# Patient Record
Sex: Female | Born: 1985 | Hispanic: No | Marital: Single | Smoking: Former smoker
Health system: Southern US, Community
[De-identification: ages and names within clinical notes are randomized; demographics above are authoritative.]

## PROBLEM LIST (undated history)

## (undated) ENCOUNTER — Inpatient Hospital Stay (HOSPITAL_COMMUNITY): Payer: Self-pay

## (undated) DIAGNOSIS — M722 Plantar fascial fibromatosis: Secondary | ICD-10-CM

## (undated) DIAGNOSIS — R87629 Unspecified abnormal cytological findings in specimens from vagina: Secondary | ICD-10-CM

## (undated) DIAGNOSIS — N879 Dysplasia of cervix uteri, unspecified: Secondary | ICD-10-CM

## (undated) HISTORY — DX: Unspecified abnormal cytological findings in specimens from vagina: R87.629

---

## 2009-05-14 HISTORY — PX: WISDOM TOOTH EXTRACTION: SHX21

## 2014-01-08 ENCOUNTER — Encounter (HOSPITAL_COMMUNITY): Payer: Self-pay | Admitting: Emergency Medicine

## 2014-01-08 ENCOUNTER — Emergency Department (HOSPITAL_COMMUNITY)
Admission: EM | Admit: 2014-01-08 | Discharge: 2014-01-08 | Discharge status: Against Medical Advice | Payer: Medicaid Other | Attending: Emergency Medicine | Admitting: Emergency Medicine

## 2014-01-08 DIAGNOSIS — R110 Nausea: Secondary | ICD-10-CM | POA: Insufficient documentation

## 2014-01-08 DIAGNOSIS — O9989 Other specified diseases and conditions complicating pregnancy, childbirth and the puerperium: Secondary | ICD-10-CM | POA: Insufficient documentation

## 2014-01-08 HISTORY — DX: Dysplasia of cervix uteri, unspecified: N87.9

## 2014-01-08 LAB — URINALYSIS, ROUTINE W REFLEX MICROSCOPIC
Bilirubin Urine: NEGATIVE
Glucose, UA: NEGATIVE mg/dL
Hgb urine dipstick: NEGATIVE
KETONES UR: NEGATIVE mg/dL
LEUKOCYTES UA: NEGATIVE
NITRITE: NEGATIVE
PROTEIN: NEGATIVE mg/dL
Specific Gravity, Urine: 1.019 (ref 1.005–1.030)
Urobilinogen, UA: 0.2 mg/dL (ref 0.0–1.0)
pH: 5 (ref 5.0–8.0)

## 2014-01-08 LAB — POC URINE PREG, ED: Preg Test, Ur: POSITIVE — AB

## 2014-01-08 NOTE — ED Notes (Signed)
Presents [redacted] weeks pregnant with constant nausea all day, given RX at [facility] for phenergan-will not take it because she read it causes birth defects. denise vaginal pain or bleeding-reports mild lower abdominal cramping. Reports white milky vaginal discharge.

## 2014-01-08 NOTE — ED Notes (Signed)
Pt does not wish to stay any longer

## 2014-03-04 ENCOUNTER — Inpatient Hospital Stay (HOSPITAL_COMMUNITY)
Admission: AD | Admit: 2014-03-04 | Discharge: 2014-03-04 | Disposition: A | Discharge status: Home / Self Care | Payer: Medicaid Other | Source: Ambulatory Visit | Attending: Obstetrics and Gynecology | Admitting: Obstetrics and Gynecology

## 2014-03-04 ENCOUNTER — Encounter (HOSPITAL_COMMUNITY): Payer: Self-pay

## 2014-03-04 DIAGNOSIS — W19XXXA Unspecified fall, initial encounter: Secondary | ICD-10-CM

## 2014-03-04 DIAGNOSIS — A5901 Trichomonal vulvovaginitis: Secondary | ICD-10-CM | POA: Diagnosis not present

## 2014-03-04 DIAGNOSIS — Y92009 Unspecified place in unspecified non-institutional (private) residence as the place of occurrence of the external cause: Secondary | ICD-10-CM

## 2014-03-04 DIAGNOSIS — Y9389 Activity, other specified: Secondary | ICD-10-CM

## 2014-03-04 DIAGNOSIS — Z3A14 14 weeks gestation of pregnancy: Secondary | ICD-10-CM | POA: Insufficient documentation

## 2014-03-04 DIAGNOSIS — O98312 Other infections with a predominantly sexual mode of transmission complicating pregnancy, second trimester: Secondary | ICD-10-CM | POA: Diagnosis not present

## 2014-03-04 DIAGNOSIS — R109 Unspecified abdominal pain: Secondary | ICD-10-CM | POA: Diagnosis not present

## 2014-03-04 DIAGNOSIS — O26892 Other specified pregnancy related conditions, second trimester: Secondary | ICD-10-CM | POA: Insufficient documentation

## 2014-03-04 DIAGNOSIS — W010XXA Fall on same level from slipping, tripping and stumbling without subsequent striking against object, initial encounter: Secondary | ICD-10-CM | POA: Insufficient documentation

## 2014-03-04 DIAGNOSIS — O99212 Obesity complicating pregnancy, second trimester: Secondary | ICD-10-CM | POA: Diagnosis not present

## 2014-03-04 DIAGNOSIS — O23592 Infection of other part of genital tract in pregnancy, second trimester: Secondary | ICD-10-CM

## 2014-03-04 DIAGNOSIS — S3981XA Other specified injuries of abdomen, initial encounter: Secondary | ICD-10-CM

## 2014-03-04 LAB — URINE MICROSCOPIC-ADD ON

## 2014-03-04 LAB — URINALYSIS, ROUTINE W REFLEX MICROSCOPIC
BILIRUBIN URINE: NEGATIVE
Glucose, UA: NEGATIVE mg/dL
HGB URINE DIPSTICK: NEGATIVE
KETONES UR: NEGATIVE mg/dL
NITRITE: NEGATIVE
PROTEIN: NEGATIVE mg/dL
Specific Gravity, Urine: 1.025 (ref 1.005–1.030)
Urobilinogen, UA: 0.2 mg/dL (ref 0.0–1.0)
pH: 5.5 (ref 5.0–8.0)

## 2014-03-04 MED ORDER — PRENATAL PLUS 27-1 MG PO TABS: 1.0000 | ORAL_TABLET | Freq: Every day | ORAL | Status: DC

## 2014-03-04 MED ORDER — METRONIDAZOLE 500 MG PO TABS: 500.0000 mg | ORAL_TABLET | Freq: Three times a day (TID) | ORAL | Status: DC

## 2014-03-04 MED ORDER — ONDANSETRON 8 MG PO TBDP: 8.0000 mg | ORAL_TABLET | Freq: Once | ORAL | Status: DC

## 2014-03-04 NOTE — MAU Provider Note (Signed)

## 2014-03-04 NOTE — MAU Provider Note (Signed)
Chief Complaint: Fall   First Provider Initiated Contact with Patient 03/04/14 1808     SUBJECTIVE HPI: Jenna Tran is a 28 y.o. G4P2012 at 5151w5d by LMP who presents with mild abdominal cramping after tripping over her puppy today and landing on belly. Denies injury but concerned for fetal well-being. Denies abnormal vaginal discharge.   Pregnancy course: No other problems but is in process of changing providers (to [facility] from Dr. Shawnie Ponsorn who did PN labs and US, no exam)   Past Medical History  Diagnosis Date  . Dysplasia, cervix uteri    OB History  Gravida Para Term Preterm AB SAB TAB Ectopic Multiple Living  4 2 2  1 1    2     # Outcome Date GA Lbr Len/2nd Weight Sex Delivery Anes PTL Lv  4 CUR           3 SAB           2 TRM      SVD   Y  1 TRM      SVD   Y     History reviewed. No pertinent past surgical history. History   Social History  . Marital Status: Single    Spouse Name: N/A    Number of Children: N/A  . Years of Education: N/A   Occupational History  . Not on file.   Social History Main Topics  . Smoking status: Never Smoker   . Smokeless tobacco: Not on file  . Alcohol Use: No  . Drug Use: No  . Sexual Activity: Yes    Birth Control/ Protection: None   Other Topics Concern  . Not on file   Social History Narrative  . No narrative on file   No current facility-administered medications on file prior to encounter.   No current outpatient prescriptions on file prior to encounter.   No Known Allergies  ROS: Pertinent items in HPI  OBJECTIVE Blood pressure 119/75, pulse 93, temperature 98.4 F (36.9 C), temperature source Oral, resp. rate 16, height 5\' 2"  (1.575 m), weight 88.179 kg (194 lb 6.4 oz), last menstrual period 11/21/2013, SpO2 98.00%. GENERAL: Well-developed, well-nourished female in no acute distress.  HEENT: Normocephalic HEART: normal rate RESP: normal effort ABDOMEN: Soft, non-tender, skin intact, 14-16 wk size;  DT140 EXTREMITIES: Nontender, no edema NEURO: Alert and oriented  LAB RESULTS Results for orders placed during the hospital encounter of 03/04/14 (from the past 24 hour(s))  URINALYSIS, ROUTINE W REFLEX MICROSCOPIC     Status: Abnormal   Collection Time    03/04/14  4:07 PM      Result Value Ref Range   Color, Urine YELLOW  YELLOW   APPearance CLEAR  CLEAR   Specific Gravity, Urine 1.025  1.005 - 1.030   pH 5.5  5.0 - 8.0   Glucose, UA NEGATIVE  NEGATIVE mg/dL   Hgb urine dipstick NEGATIVE  NEGATIVE   Bilirubin Urine NEGATIVE  NEGATIVE   Ketones, ur NEGATIVE  NEGATIVE mg/dL   Protein, ur NEGATIVE  NEGATIVE mg/dL   Urobilinogen, UA 0.2  0.0 - 1.0 mg/dL   Nitrite NEGATIVE  NEGATIVE   Leukocytes, UA MODERATE (*) NEGATIVE  URINE MICROSCOPIC-ADD ON     Status: Abnormal   Collection Time    03/04/14  4:07 PM      Result Value Ref Range   Squamous Epithelial / LPF FEW (*) RARE   WBC, UA 11-20  <3 WBC/hpf   RBC / HPF 7-10  <  3 RBC/hpf   Bacteria, UA FEW (*) RARE   Urine-Other TRICHOMONAS PRESENT      IMAGING No results found.  MAU COURSE  ASSESSMENT 1. Fall at home, initial encounter   2. Trichomonal vaginitis during pregnancy, second trimester   G1 at 2855w5d viable pregnancy Obesity  PLAN Discharge home with reassurance Advised partner tx for trich also and to get TOC at [facility]   Medication List         metroNIDAZOLE 500 MG tablet  Commonly known as:  FLAGYL  Take 1 tablet (500 mg total) by mouth 3 (three) times daily.     prenatal vitamin w/FE, FA 27-1 MG Tabs tablet  Take 1 tablet by mouth daily.      Tylenol for pain  Follow-up Information   [facility] (Keep yur scheduled appointment)    Contact information:   [address] [place] [id number] [phone]      Danae OrleansDeirdre C Teal Raben, CNM 03/04/2014  6:12 PM

## 2014-03-04 NOTE — MAU Note (Signed)
Patient state she tripped and fell over your puppy and hit her abdomen at 1200. States she has light cramping, no bleeding or discharge.

## 2014-03-11 ENCOUNTER — Inpatient Hospital Stay (HOSPITAL_COMMUNITY)
Admission: AD | Admit: 2014-03-11 | Discharge: 2014-03-11 | Disposition: A | Discharge status: Home / Self Care | Payer: Medicaid Other | Source: Ambulatory Visit | Attending: Obstetrics & Gynecology | Admitting: Obstetrics & Gynecology

## 2014-03-11 DIAGNOSIS — Z3482 Encounter for supervision of other normal pregnancy, second trimester: Secondary | ICD-10-CM | POA: Diagnosis present

## 2014-03-11 DIAGNOSIS — Z3A15 15 weeks gestation of pregnancy: Secondary | ICD-10-CM | POA: Diagnosis not present

## 2014-03-11 DIAGNOSIS — O0932 Supervision of pregnancy with insufficient antenatal care, second trimester: Secondary | ICD-10-CM

## 2014-03-11 NOTE — Discharge Instructions (Signed)
Vaginal Bleeding During Pregnancy, Second Trimester ° A small amount of bleeding (spotting) from the vagina is common in pregnancy. Sometimes the bleeding is normal and is not a problem, and sometimes it is a sign of something serious. Be sure to tell your doctor about any bleeding from your vagina right away. °HOME CARE °· Watch your condition for any changes. °· Follow your doctor's instructions about how active you can be. °· If you are on bed rest: °¨ You may need to stay in bed and only get up to use the bathroom. °¨ You may be allowed to do some activities. °¨ If you need help, make plans for someone to help you. °· Write down: °¨ The number of pads you use each day. °¨ How often you change pads. °¨ How soaked (saturated) your pads are. °· Do not use tampons. °· Do not douche. °· Do not have sex or orgasms until your doctor says it is okay. °· If you pass any tissue from your vagina, save the tissue so you can show it to your doctor. °· Only take medicines as told by your doctor. °· Do not take aspirin because it can make you bleed. °· Do not exercise, lift heavy weights, or do any activities that take a lot of energy and effort unless your doctor says it is okay. °· Keep all follow-up visits as told by your doctor. °GET HELP IF:  °· You bleed from your vagina. °· You have cramps. °· You have labor pains. °· You have a fever that does not go away after you take medicine. °GET HELP RIGHT AWAY IF: °· You have very bad cramps in your back or belly (abdomen). °· You have contractions. °· You have chills. °· You pass large clots or tissue from your vagina. °· You bleed more. °· You feel light-headed or weak. °· You pass out (faint). °· You are leaking fluid or have a gush of fluid from your vagina. °MAKE SURE YOU: °· Understand these instructions. °· Will watch your condition. °· Will get help right away if you are not doing well or get worse. °Document Released: 09/14/2013 Document Reviewed: 01/05/2013 °ExitCare®  Patient Information ©2015 [employer]. This information is not intended to replace advice given to you by your health care provider. Make sure you discuss any questions you have with your health care provider. ° °

## 2014-03-11 NOTE — MAU Provider Note (Signed)
Ms. Jenna Tran is a 28 y.o. [phone] at 493w5d who presents to MAU today asking for help to establish prenatal care. Patient denies any complications with this or previous pregnancy. She also denies complaints today. She would like to have a CNM.   BP 126/67  Pulse 97  Temp(Src) 98.3 F (36.8 C) (Oral)  Resp 18  Ht 5\' 1"  (1.549 m)  Wt 194 lb 6.4 oz (88.179 kg)  BMI 36.75 kg/m2  LMP 11/21/2013 GENERAL: Well-developed, well-nourished female in no acute distress.  HEENT: Normocephalic, atraumatic.   LUNGS: Effort normal HEART: Regular rate  SKIN: Warm, dry and without erythema PSYCH: Normal mood and affect  MDM FHR - 135 bpm with doppler  A: SIUP at 533w5d  P: Discharge home [facility]. They will call her with an appointment Patient may return to MAU as needed or if her condition were to change or worsen  Marny LowensteinJulie N Winson Eichorn, PA-C 03/11/2014 4:51 PM

## 2014-03-11 NOTE — MAU Provider Note (Signed)
Attestation of Attending Supervision of Advanced Practitioner (CNM/NP): Evaluation and management procedures were performed by the Advanced Practitioner under my supervision and collaboration.  I have reviewed the Advanced Practitioner's note and chart, and I agree with the management and plan.  HARRAWAY-SMITH, Takeira Yanes 7:38 PM     

## 2014-03-11 NOTE — MAU Note (Signed)
Pt discussed her concerns and questions with J.Wenzel,PA. List given for midwives that are in the area.

## 2014-03-11 NOTE — MAU Note (Signed)
Pt has been trying to establish prenatal care. Having difficulty with that process. Nopt having any promplems today. Has some questions for midwife.

## 2014-03-16 ENCOUNTER — Encounter (HOSPITAL_COMMUNITY): Payer: Self-pay

## 2014-03-24 ENCOUNTER — Encounter: Payer: Medicaid Other | Admitting: Advanced Practice Midwife

## 2014-03-29 ENCOUNTER — Ambulatory Visit (INDEPENDENT_AMBULATORY_CARE_PROVIDER_SITE_OTHER): Payer: Medicaid Other | Admitting: Family Medicine

## 2014-03-29 ENCOUNTER — Other Ambulatory Visit: Payer: Self-pay | Admitting: Obstetrics and Gynecology

## 2014-03-29 ENCOUNTER — Encounter: Payer: Self-pay | Admitting: Obstetrics and Gynecology

## 2014-03-29 ENCOUNTER — Other Ambulatory Visit (HOSPITAL_COMMUNITY)
Admission: RE | Admit: 2014-03-29 | Discharge: 2014-03-29 | Disposition: A | Discharge status: Home / Self Care | Payer: Medicaid Other | Source: Ambulatory Visit | Attending: Obstetrics and Gynecology | Admitting: Obstetrics and Gynecology

## 2014-03-29 VITALS — BP 112/68 | HR 83 | Wt 196.1 lb

## 2014-03-29 DIAGNOSIS — Z3492 Encounter for supervision of normal pregnancy, unspecified, second trimester: Secondary | ICD-10-CM

## 2014-03-29 DIAGNOSIS — Z3490 Encounter for supervision of normal pregnancy, unspecified, unspecified trimester: Secondary | ICD-10-CM | POA: Insufficient documentation

## 2014-03-29 DIAGNOSIS — O0932 Supervision of pregnancy with insufficient antenatal care, second trimester: Secondary | ICD-10-CM

## 2014-03-29 DIAGNOSIS — T7422XS Child sexual abuse, confirmed, sequela: Secondary | ICD-10-CM

## 2014-03-29 DIAGNOSIS — Z1151 Encounter for screening for human papillomavirus (HPV): Secondary | ICD-10-CM | POA: Insufficient documentation

## 2014-03-29 DIAGNOSIS — R87810 Cervical high risk human papillomavirus (HPV) DNA test positive: Secondary | ICD-10-CM | POA: Diagnosis present

## 2014-03-29 DIAGNOSIS — Z8619 Personal history of other infectious and parasitic diseases: Secondary | ICD-10-CM

## 2014-03-29 DIAGNOSIS — Z87440 Personal history of urinary (tract) infections: Secondary | ICD-10-CM

## 2014-03-29 DIAGNOSIS — Z01419 Encounter for gynecological examination (general) (routine) without abnormal findings: Secondary | ICD-10-CM | POA: Insufficient documentation

## 2014-03-29 DIAGNOSIS — T7422XA Child sexual abuse, confirmed, initial encounter: Secondary | ICD-10-CM | POA: Insufficient documentation

## 2014-03-29 LAB — POCT URINALYSIS DIP (DEVICE)
Bilirubin Urine: NEGATIVE
Glucose, UA: NEGATIVE mg/dL
Hgb urine dipstick: NEGATIVE
Ketones, ur: NEGATIVE mg/dL
NITRITE: NEGATIVE
Protein, ur: NEGATIVE mg/dL
SPECIFIC GRAVITY, URINE: 1.02 (ref 1.005–1.030)
UROBILINOGEN UA: 0.2 mg/dL (ref 0.0–1.0)
pH: 5 (ref 5.0–8.0)

## 2014-03-29 LAB — WET PREP, GENITAL
Trich, Wet Prep: NONE SEEN
YEAST WET PREP: NONE SEEN

## 2014-03-29 LAB — OB RESULTS CONSOLE GC/CHLAMYDIA
Chlamydia: NEGATIVE
Gonorrhea: NEGATIVE

## 2014-03-29 NOTE — Progress Notes (Signed)
Patient never took treatment for trich stated she wants a second opinion.

## 2014-03-29 NOTE — Progress Notes (Signed)
Anatomy U/S with Radiology 04/05/14 @ 1030a.

## 2014-03-29 NOTE — Addendum Note (Signed)
Addended by: Sherre LainASH, Abad Manard A on: 03/29/2014 01:17 PM   Modules accepted: Orders
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
## 2014-03-30 LAB — OBSTETRIC PANEL
Antibody Screen: NEGATIVE
Basophils Absolute: 0 10*3/uL (ref 0.0–0.1)
Basophils Relative: 0 % (ref 0–1)
Eosinophils Absolute: 0.1 10*3/uL (ref 0.0–0.7)
Eosinophils Relative: 1 % (ref 0–5)
HEMATOCRIT: 37.2 % (ref 36.0–46.0)
Hemoglobin: 12.4 g/dL (ref 12.0–15.0)
Hepatitis B Surface Ag: NEGATIVE
Lymphocytes Relative: 18 % (ref 12–46)
Lymphs Abs: 2.3 10*3/uL (ref 0.7–4.0)
MCH: 25.8 pg — ABNORMAL LOW (ref 26.0–34.0)
MCHC: 33.3 g/dL (ref 30.0–36.0)
MCV: 77.3 fL — AB (ref 78.0–100.0)
MONO ABS: 0.8 10*3/uL (ref 0.1–1.0)
MONOS PCT: 6 % (ref 3–12)
MPV: 10 fL (ref 9.4–12.4)
NEUTROS ABS: 9.6 10*3/uL — AB (ref 1.7–7.7)
Neutrophils Relative %: 75 % (ref 43–77)
Platelets: 349 10*3/uL (ref 150–400)
RBC: 4.81 MIL/uL (ref 3.87–5.11)
RDW: 15.9 % — ABNORMAL HIGH (ref 11.5–15.5)
RH TYPE: POSITIVE
Rubella: 2.03 Index — ABNORMAL HIGH (ref <0.90)
WBC: 12.8 10*3/uL — AB (ref 4.0–10.5)

## 2014-03-30 LAB — GC/CHLAMYDIA PROBE AMP
CT Probe RNA: NEGATIVE
GC Probe RNA: NEGATIVE

## 2014-03-30 LAB — HIV ANTIBODY (ROUTINE TESTING W REFLEX): HIV 1&2 Ab, 4th Generation: NONREACTIVE

## 2014-03-30 MED ORDER — METRONIDAZOLE 500 MG PO TABS: 500.0000 mg | ORAL_TABLET | Freq: Two times a day (BID) | ORAL | Status: DC

## 2014-03-30 NOTE — Addendum Note (Signed)
Addended by: Elita BooneOBERTS, CAROLINE C on: 03/30/2014 12:32 AM   Modules accepted: Orders

## 2014-03-31 LAB — PRESCRIPTION MONITORING PROFILE (19 PANEL)
Amphetamine/Meth: NEGATIVE ng/mL
BENZODIAZEPINE SCREEN, URINE: NEGATIVE ng/mL
Barbiturate Screen, Urine: NEGATIVE ng/mL
Buprenorphine, Urine: NEGATIVE ng/mL
CARISOPRODOL, URINE: NEGATIVE ng/mL
COCAINE METABOLITES: NEGATIVE ng/mL
CREATININE, URINE: 115.53 mg/dL (ref >20.0)
Cannabinoid Scrn, Ur: NEGATIVE ng/mL
ECSTASY: NEGATIVE ng/mL
FENTANYL URINE: NEGATIVE ng/mL
MEPERIDINE UR: NEGATIVE ng/mL
METHADONE SCREEN, URINE: NEGATIVE ng/mL
Methaqualone: NEGATIVE ng/mL
NITRITES URINE, INITIAL: NEGATIVE ug/mL
OXYCODONE SCRN UR: NEGATIVE ng/mL
Opiate Screen, Urine: NEGATIVE ng/mL
Phencyclidine, Ur: NEGATIVE ng/mL
Propoxyphene: NEGATIVE ng/mL
TRAMADOL UR: NEGATIVE ng/mL
Tapentadol, urine: NEGATIVE ng/mL
ZOLPIDEM, URINE: NEGATIVE ng/mL
pH, Initial: 5.3 pH (ref 4.5–8.9)

## 2014-03-31 LAB — CULTURE, OB URINE
COLONY COUNT: NO GROWTH
ORGANISM ID, BACTERIA: NO GROWTH

## 2014-03-31 LAB — CYTOLOGY - PAP

## 2014-04-05 ENCOUNTER — Ambulatory Visit (HOSPITAL_COMMUNITY)
Admission: RE | Admit: 2014-04-05 | Discharge: 2014-04-05 | Disposition: A | Discharge status: Home / Self Care | Payer: Medicaid Other | Source: Ambulatory Visit | Attending: Obstetrics and Gynecology | Admitting: Obstetrics and Gynecology

## 2014-04-05 ENCOUNTER — Other Ambulatory Visit: Payer: Self-pay | Admitting: Obstetrics and Gynecology

## 2014-04-05 DIAGNOSIS — Z3A19 19 weeks gestation of pregnancy: Secondary | ICD-10-CM | POA: Diagnosis not present

## 2014-04-05 DIAGNOSIS — Z36 Encounter for antenatal screening of mother: Secondary | ICD-10-CM | POA: Diagnosis not present

## 2014-04-05 DIAGNOSIS — Z3492 Encounter for supervision of normal pregnancy, unspecified, second trimester: Secondary | ICD-10-CM

## 2014-04-05 DIAGNOSIS — O99212 Obesity complicating pregnancy, second trimester: Secondary | ICD-10-CM | POA: Insufficient documentation

## 2014-04-05 DIAGNOSIS — Z3689 Encounter for other specified antenatal screening: Secondary | ICD-10-CM | POA: Insufficient documentation

## 2014-04-06 ENCOUNTER — Encounter: Payer: Self-pay | Admitting: Obstetrics and Gynecology

## 2014-04-06 DIAGNOSIS — IMO0002 Reserved for concepts with insufficient information to code with codable children: Secondary | ICD-10-CM | POA: Insufficient documentation

## 2014-04-07 ENCOUNTER — Telehealth: Payer: Self-pay

## 2014-04-07 NOTE — Telephone Encounter (Signed)
-----   Message from Vivien Rotaheryl A Clinton sent at 04/07/2014  8:04 AM EST -----   ----- Message -----    From: Ethelda Chickaroline Roberts, MD    Sent: 03/30/2014  12:32 AM      To: [facility] Admin Pool  Please rx flagyl 500 mg BID; let patient know she did not have trich, but has BV.

## 2014-04-07 NOTE — Telephone Encounter (Signed)
Called patient and informed her of results. Patient verbalized understanding and stated she has already picked up flagyl and is on her last pills today. No questions or concerns.

## 2014-04-26 ENCOUNTER — Ambulatory Visit (INDEPENDENT_AMBULATORY_CARE_PROVIDER_SITE_OTHER): Payer: Medicaid Other | Admitting: Obstetrics and Gynecology

## 2014-04-26 VITALS — BP 107/59 | HR 87 | Temp 98.2°F | Wt 196.3 lb

## 2014-04-26 DIAGNOSIS — Z23 Encounter for immunization: Secondary | ICD-10-CM

## 2014-04-26 DIAGNOSIS — Z3492 Encounter for supervision of normal pregnancy, unspecified, second trimester: Secondary | ICD-10-CM

## 2014-04-26 LAB — POCT URINALYSIS DIP (DEVICE)
BILIRUBIN URINE: NEGATIVE
Glucose, UA: NEGATIVE mg/dL
KETONES UR: NEGATIVE mg/dL
LEUKOCYTES UA: NEGATIVE
Nitrite: NEGATIVE
Protein, ur: NEGATIVE mg/dL
SPECIFIC GRAVITY, URINE: 1.02 (ref 1.005–1.030)
Urobilinogen, UA: 0.2 mg/dL (ref 0.0–1.0)
pH: 5 (ref 5.0–8.0)

## 2014-04-26 NOTE — Progress Notes (Signed)
Follow-up U/S with Radiology 05/03/14 @ 945a.

## 2014-04-26 NOTE — Progress Notes (Signed)
C/o little, sharp pains at times in pelvis. C/o sweating a lot in her sleep.

## 2014-04-26 NOTE — Progress Notes (Signed)
Doing well today. No concerns or complaints.  1. Routine PNC. Reviewed labs, up to date. Anatomy scan, normal female with incomplete views of the heart. Needs follow up ultrasound, scheduled today. Desires waterbirth.

## 2014-05-04 ENCOUNTER — Ambulatory Visit (HOSPITAL_COMMUNITY)
Admission: RE | Admit: 2014-05-04 | Discharge: 2014-05-04 | Disposition: A | Discharge status: Home / Self Care | Payer: Medicaid Other | Source: Ambulatory Visit | Attending: Obstetrics and Gynecology | Admitting: Obstetrics and Gynecology

## 2014-05-04 DIAGNOSIS — Z3492 Encounter for supervision of normal pregnancy, unspecified, second trimester: Secondary | ICD-10-CM | POA: Diagnosis not present

## 2014-05-04 DIAGNOSIS — Z3A23 23 weeks gestation of pregnancy: Secondary | ICD-10-CM | POA: Insufficient documentation

## 2014-05-04 DIAGNOSIS — Z0489 Encounter for examination and observation for other specified reasons: Secondary | ICD-10-CM | POA: Insufficient documentation

## 2014-05-04 DIAGNOSIS — IMO0002 Reserved for concepts with insufficient information to code with codable children: Secondary | ICD-10-CM | POA: Insufficient documentation

## 2014-05-14 NOTE — L&D Delivery Note (Signed)
Delivery Note At 2:02 PM a viable and healthy female was delivered via  (Presentation: Left Occiput Anterior).  APGAR: 9, 9; weight  .   Placenta status: spontaneous, intact .  Cord: 3 vessels with the following complications:none.  Cord pH: n/a  Anesthesia: Epidural  Episiotomy:  None Lacerations:  1st degree, hemostatic, not repaired Suture Repair: n/a Est. Blood Loss (mL):  150  Mom to postpartum.  Baby to Couplet care / Skin to Skin.  Delivery supervised by Zerita Boersarlene Marlet Korte, CNM  Felton Clintonoss,Lisa Wynne, SNM 08/29/2014, 2:54 PM

## 2014-05-17 ENCOUNTER — Encounter: Payer: Medicaid Other | Admitting: Obstetrics and Gynecology

## 2014-05-20 ENCOUNTER — Ambulatory Visit (INDEPENDENT_AMBULATORY_CARE_PROVIDER_SITE_OTHER): Payer: Medicaid Other | Admitting: Obstetrics & Gynecology

## 2014-05-20 VITALS — BP 102/54 | HR 85 | Temp 98.4°F | Wt 197.5 lb

## 2014-05-20 DIAGNOSIS — Z3492 Encounter for supervision of normal pregnancy, unspecified, second trimester: Secondary | ICD-10-CM

## 2014-05-20 LAB — POCT URINALYSIS DIP (DEVICE)
Bilirubin Urine: NEGATIVE
GLUCOSE, UA: NEGATIVE mg/dL
Hgb urine dipstick: NEGATIVE
KETONES UR: NEGATIVE mg/dL
NITRITE: NEGATIVE
PH: 5.5 (ref 5.0–8.0)
PROTEIN: NEGATIVE mg/dL
Specific Gravity, Urine: 1.02 (ref 1.005–1.030)
UROBILINOGEN UA: 0.2 mg/dL (ref 0.0–1.0)

## 2014-05-20 NOTE — Progress Notes (Signed)
Pressure, no UC or ROM

## 2014-05-20 NOTE — Patient Instructions (Signed)
Second Trimester of Pregnancy The second trimester is from week 13 through week 28, months 4 through 6. The second trimester is often a time when you feel your best. Your body has also adjusted to being pregnant, and you begin to feel better physically. Usually, morning sickness has lessened or quit completely, you may have more energy, and you may have an increase in appetite. The second trimester is also a time when the fetus is growing rapidly. At the end of the sixth month, the fetus is about 9 inches long and weighs about 1 pounds. You will likely begin to feel the baby move (quickening) between 18 and 20 weeks of the pregnancy. BODY CHANGES Your body goes through many changes during pregnancy. The changes vary from woman to woman.   Your weight will continue to increase. You will notice your lower abdomen bulging out.  You may begin to get stretch marks on your hips, abdomen, and breasts.  You may develop headaches that can be relieved by medicines approved by your health care provider.  You may urinate more often because the fetus is pressing on your bladder.  You may develop or continue to have heartburn as a result of your pregnancy.  You may develop constipation because certain hormones are causing the muscles that push waste through your intestines to slow down.  You may develop hemorrhoids or swollen, bulging veins (varicose veins).  You may have back pain because of the weight gain and pregnancy hormones relaxing your joints between the bones in your pelvis and as a result of a shift in weight and the muscles that support your balance.  Your breasts will continue to grow and be tender.  Your gums may bleed and may be sensitive to brushing and flossing.  Dark spots or blotches (chloasma, mask of pregnancy) may develop on your face. This will likely fade after the baby is born.  A dark line from your belly button to the pubic area (linea nigra) may appear. This will likely fade  after the baby is born.  You may have changes in your hair. These can include thickening of your hair, rapid growth, and changes in texture. Some women also have hair loss during or after pregnancy, or hair that feels dry or thin. Your hair will most likely return to normal after your baby is born. WHAT TO EXPECT AT YOUR PRENATAL VISITS During a routine prenatal visit:  You will be weighed to make sure you and the fetus are growing normally.  Your blood pressure will be taken.  Your abdomen will be measured to track your baby's growth.  The fetal heartbeat will be listened to.  Any test results from the previous visit will be discussed. Your health care provider may ask you:  How you are feeling.  If you are feeling the baby move.  If you have had any abnormal symptoms, such as leaking fluid, bleeding, severe headaches, or abdominal cramping.  If you have any questions. Other tests that may be performed during your second trimester include:  Blood tests that check for:  Low iron levels (anemia).  Gestational diabetes (between 24 and 28 weeks).  Rh antibodies.  Urine tests to check for infections, diabetes, or protein in the urine.  An ultrasound to confirm the proper growth and development of the baby.  An amniocentesis to check for possible genetic problems.  Fetal screens for spina bifida and Down syndrome. HOME CARE INSTRUCTIONS   Avoid all smoking, herbs, alcohol, and unprescribed   drugs. These chemicals affect the formation and growth of the baby.  Follow your health care provider's instructions regarding medicine use. There are medicines that are either safe or unsafe to take during pregnancy.  Exercise only as directed by your health care provider. Experiencing uterine cramps is a good sign to stop exercising.  Continue to eat regular, healthy meals.  Wear a good support bra for breast tenderness.  Do not use hot tubs, steam rooms, or saunas.  Wear your  seat belt at all times when driving.  Avoid raw meat, uncooked cheese, cat litter boxes, and soil used by cats. These carry germs that can cause birth defects in the baby.  Take your prenatal vitamins.  Try taking a stool softener (if your health care provider approves) if you develop constipation. Eat more high-fiber foods, such as fresh vegetables or fruit and whole grains. Drink plenty of fluids to keep your urine clear or pale yellow.  Take warm sitz baths to soothe any pain or discomfort caused by hemorrhoids. Use hemorrhoid cream if your health care provider approves.  If you develop varicose veins, wear support hose. Elevate your feet for 15 minutes, 3-4 times a day. Limit salt in your diet.  Avoid heavy lifting, wear low heel shoes, and practice good posture.  Rest with your legs elevated if you have leg cramps or low back pain.  Visit your dentist if you have not gone yet during your pregnancy. Use a soft toothbrush to brush your teeth and be gentle when you floss.  A sexual relationship may be continued unless your health care provider directs you otherwise.  Continue to go to all your prenatal visits as directed by your health care provider. SEEK MEDICAL CARE IF:   You have dizziness.  You have mild pelvic cramps, pelvic pressure, or nagging pain in the abdominal area.  You have persistent nausea, vomiting, or diarrhea.  You have a bad smelling vaginal discharge.  You have pain with urination. SEEK IMMEDIATE MEDICAL CARE IF:   You have a fever.  You are leaking fluid from your vagina.  You have spotting or bleeding from your vagina.  You have severe abdominal cramping or pain.  You have rapid weight gain or loss.  You have shortness of breath with chest pain.  You notice sudden or extreme swelling of your face, hands, ankles, feet, or legs.  You have not felt your baby move in over an hour.  You have severe headaches that do not go away with  medicine.  You have vision changes. Document Released: 04/24/2001 Document Revised: 05/05/2013 Document Reviewed: 07/01/2012 ExitCare Patient Information 2015 [employer]. This information is not intended to replace advice given to you by your health care provider. Make sure you discuss any questions you have with your health care provider.  

## 2014-05-20 NOTE — Progress Notes (Signed)
C/o of pelvic pressure.  

## 2014-06-10 ENCOUNTER — Encounter: Payer: Self-pay | Admitting: Family

## 2014-06-10 ENCOUNTER — Encounter: Payer: Medicaid Other | Admitting: Family

## 2014-06-10 ENCOUNTER — Telehealth: Payer: Self-pay | Admitting: Obstetrics & Gynecology

## 2014-06-10 NOTE — Telephone Encounter (Signed)
Called patient could not leave message, mailing certified letter.

## 2014-06-14 ENCOUNTER — Other Ambulatory Visit: Payer: Self-pay | Admitting: Obstetrics and Gynecology

## 2014-06-14 DIAGNOSIS — Q228 Other congenital malformations of tricuspid valve: Secondary | ICD-10-CM

## 2014-06-15 ENCOUNTER — Telehealth: Payer: Self-pay

## 2014-06-15 NOTE — Telephone Encounter (Signed)
-----   Message from Morgan McEacWilliam Daltonhern, MD sent at 06/14/2014  2:06 PM EST ----- Regarding: Schedule Ultrasound This patient needs a follow up ultrasound and fetal echo with MFM. Can this be scheduled ASAP? Please call patient with appointment time.   Thanks,  Lequita HaltMorgan

## 2014-06-15 NOTE — Telephone Encounter (Signed)
Fetal Echo scheduled with [facility] (Dr. Mayer Camelatum) on 06/17/14 at 0900. U/S, visit notes and demographics to be faxed from front office staff to [phone]. Follow up U/S scheduled for 06/18/14 at 0930 in [facility]. Called patient and informed her of appointment dates, times and locations-- explained appointments are to ensure baby's heart is growing and functioning appropriately as they were unable to view all parts in the prior ultrasound. Patient verbalized understanding and gratitude and states she will be at appointment here in clinic tomorrow. No questions or concerns.

## 2014-06-16 ENCOUNTER — Ambulatory Visit (INDEPENDENT_AMBULATORY_CARE_PROVIDER_SITE_OTHER): Payer: Medicaid Other | Admitting: Physician Assistant

## 2014-06-16 VITALS — BP 115/68 | Wt 197.6 lb

## 2014-06-16 DIAGNOSIS — Z3493 Encounter for supervision of normal pregnancy, unspecified, third trimester: Secondary | ICD-10-CM

## 2014-06-16 LAB — POCT URINALYSIS DIP (DEVICE)
BILIRUBIN URINE: NEGATIVE
Glucose, UA: NEGATIVE mg/dL
Ketones, ur: NEGATIVE mg/dL
Nitrite: NEGATIVE
PH: 5 (ref 5.0–8.0)
Protein, ur: NEGATIVE mg/dL
SPECIFIC GRAVITY, URINE: 1.02 (ref 1.005–1.030)
UROBILINOGEN UA: 0.2 mg/dL (ref 0.0–1.0)

## 2014-06-16 LAB — CBC
HEMATOCRIT: 35.8 % — AB (ref 36.0–46.0)
Hemoglobin: 11.9 g/dL — ABNORMAL LOW (ref 12.0–15.0)
MCH: 25.4 pg — ABNORMAL LOW (ref 26.0–34.0)
MCHC: 33.2 g/dL (ref 30.0–36.0)
MCV: 76.3 fL — AB (ref 78.0–100.0)
MPV: 9.7 fL (ref 8.6–12.4)
Platelets: 238 10*3/uL (ref 150–400)
RBC: 4.69 MIL/uL (ref 3.87–5.11)
RDW: 16.1 % — AB (ref 11.5–15.5)
WBC: 11.2 10*3/uL — ABNORMAL HIGH (ref 4.0–10.5)

## 2014-06-16 NOTE — Progress Notes (Signed)
Pt complaining of heartburn.

## 2014-06-16 NOTE — Progress Notes (Signed)
29 weeks, stable but with increased heartburn.  Denies LOF, vaginal bleeding, dysuria.  Endorses good fetal movement.  Pt uncertain but reports taking maybe Prilosec but not sure of dose.  May use Tums or Zantac.  Next visit, bring in exactly what she is taking.  RTC 2 weeks ROB

## 2014-06-16 NOTE — Patient Instructions (Signed)
Third Trimester of Pregnancy The third trimester is from week 29 through week 42, months 7 through 9. The third trimester is a time when the fetus is growing rapidly. At the end of the ninth month, the fetus is about 20 inches in length and weighs 6-10 pounds.  BODY CHANGES Your body goes through many changes during pregnancy. The changes vary from woman to woman.   Your weight will continue to increase. You can expect to gain 25-35 pounds (11-16 kg) by the end of the pregnancy.  You may begin to get stretch marks on your hips, abdomen, and breasts.  You may urinate more often because the fetus is moving lower into your pelvis and pressing on your bladder.  You may develop or continue to have heartburn as a result of your pregnancy.  You may develop constipation because certain hormones are causing the muscles that push waste through your intestines to slow down.  You may develop hemorrhoids or swollen, bulging veins (varicose veins).  You may have pelvic pain because of the weight gain and pregnancy hormones relaxing your joints between the bones in your pelvis. Backaches may result from overexertion of the muscles supporting your posture.  You may have changes in your hair. These can include thickening of your hair, rapid growth, and changes in texture. Some women also have hair loss during or after pregnancy, or hair that feels dry or thin. Your hair will most likely return to normal after your baby is born.  Your breasts will continue to grow and be tender. A yellow discharge may leak from your breasts called colostrum.  Your belly button may stick out.  You may feel short of breath because of your expanding uterus.  You may notice the fetus "dropping," or moving lower in your abdomen.  You may have a bloody mucus discharge. This usually occurs a few days to a week before labor begins.  Your cervix becomes thin and soft (effaced) near your due date. WHAT TO EXPECT AT YOUR PRENATAL  EXAMS  You will have prenatal exams every 2 weeks until week 36. Then, you will have weekly prenatal exams. During a routine prenatal visit:  You will be weighed to make sure you and the fetus are growing normally.  Your blood pressure is taken.  Your abdomen will be measured to track your baby's growth.  The fetal heartbeat will be listened to.  Any test results from the previous visit will be discussed.  You may have a cervical check near your due date to see if you have effaced. At around 36 weeks, your caregiver will check your cervix. At the same time, your caregiver will also perform a test on the secretions of the vaginal tissue. This test is to determine if a type of bacteria, Group B streptococcus, is present. Your caregiver will explain this further. Your caregiver may ask you:  What your birth plan is.  How you are feeling.  If you are feeling the baby move.  If you have had any abnormal symptoms, such as leaking fluid, bleeding, severe headaches, or abdominal cramping.  If you have any questions. Other tests or screenings that may be performed during your third trimester include:  Blood tests that check for low iron levels (anemia).  Fetal testing to check the health, activity level, and growth of the fetus. Testing is done if you have certain medical conditions or if there are problems during the pregnancy. FALSE LABOR You may feel small, irregular contractions that   eventually go away. These are called Braxton Hicks contractions, or false labor. Contractions may last for hours, days, or even weeks before true labor sets in. If contractions come at regular intervals, intensify, or become painful, it is best to be seen by your caregiver.  SIGNS OF LABOR   Menstrual-like cramps.  Contractions that are 5 minutes apart or less.  Contractions that start on the top of the uterus and spread down to the lower abdomen and back.  A sense of increased pelvic pressure or back  pain.  A watery or bloody mucus discharge that comes from the vagina. If you have any of these signs before the 37th week of pregnancy, call your caregiver right away. You need to go to the hospital to get checked immediately. HOME CARE INSTRUCTIONS   Avoid all smoking, herbs, alcohol, and unprescribed drugs. These chemicals affect the formation and growth of the baby.  Follow your caregiver's instructions regarding medicine use. There are medicines that are either safe or unsafe to take during pregnancy.  Exercise only as directed by your caregiver. Experiencing uterine cramps is a good sign to stop exercising.  Continue to eat regular, healthy meals.  Wear a good support bra for breast tenderness.  Do not use hot tubs, steam rooms, or saunas.  Wear your seat belt at all times when driving.  Avoid raw meat, uncooked cheese, cat litter boxes, and soil used by cats. These carry germs that can cause birth defects in the baby.  Take your prenatal vitamins.  Try taking a stool softener (if your caregiver approves) if you develop constipation. Eat more high-fiber foods, such as fresh vegetables or fruit and whole grains. Drink plenty of fluids to keep your urine clear or pale yellow.  Take warm sitz baths to soothe any pain or discomfort caused by hemorrhoids. Use hemorrhoid cream if your caregiver approves.  If you develop varicose veins, wear support hose. Elevate your feet for 15 minutes, 3-4 times a day. Limit salt in your diet.  Avoid heavy lifting, wear low heal shoes, and practice good posture.  Rest a lot with your legs elevated if you have leg cramps or low back pain.  Visit your dentist if you have not gone during your pregnancy. Use a soft toothbrush to brush your teeth and be gentle when you floss.  A sexual relationship may be continued unless your caregiver directs you otherwise.  Do not travel far distances unless it is absolutely necessary and only with the approval  of your caregiver.  Take prenatal classes to understand, practice, and ask questions about the labor and delivery.  Make a trial run to the hospital.  Pack your hospital bag.  Prepare the baby's nursery.  Continue to go to all your prenatal visits as directed by your caregiver. SEEK MEDICAL CARE IF:  You are unsure if you are in labor or if your water has broken.  You have dizziness.  You have mild pelvic cramps, pelvic pressure, or nagging pain in your abdominal area.  You have persistent nausea, vomiting, or diarrhea.  You have a bad smelling vaginal discharge.  You have pain with urination. SEEK IMMEDIATE MEDICAL CARE IF:   You have a fever.  You are leaking fluid from your vagina.  You have spotting or bleeding from your vagina.  You have severe abdominal cramping or pain.  You have rapid weight loss or gain.  You have shortness of breath with chest pain.  You notice sudden or extreme swelling   of your face, hands, ankles, feet, or legs.  You have not felt your baby move in over an hour.  You have severe headaches that do not go away with medicine.  You have vision changes. Document Released: 04/24/2001 Document Revised: 05/05/2013 Document Reviewed: 07/01/2012 [employer] Patient Information 2015 [employer]. This information is not intended to replace advice given to you by your health care provider. Make sure you discuss any questions you have with your health care provider.  

## 2014-06-17 ENCOUNTER — Other Ambulatory Visit (HOSPITAL_COMMUNITY): Payer: Self-pay

## 2014-06-17 LAB — GLUCOSE TOLERANCE, 1 HOUR (50G) W/O FASTING: GLUCOSE 1 HOUR GTT: 120 mg/dL (ref 70–140)

## 2014-06-17 LAB — HIV ANTIBODY (ROUTINE TESTING W REFLEX): HIV: NONREACTIVE

## 2014-06-17 LAB — RPR

## 2014-06-18 ENCOUNTER — Encounter (HOSPITAL_COMMUNITY): Payer: Self-pay

## 2014-06-18 ENCOUNTER — Ambulatory Visit (HOSPITAL_COMMUNITY)
Admission: RE | Admit: 2014-06-18 | Discharge: 2014-06-18 | Disposition: A | Discharge status: Home / Self Care | Payer: Medicaid Other | Source: Ambulatory Visit | Attending: Physician Assistant | Admitting: Physician Assistant

## 2014-06-18 DIAGNOSIS — Z36 Encounter for antenatal screening of mother: Secondary | ICD-10-CM | POA: Insufficient documentation

## 2014-06-18 DIAGNOSIS — Q228 Other congenital malformations of tricuspid valve: Secondary | ICD-10-CM

## 2014-06-18 DIAGNOSIS — Z3A29 29 weeks gestation of pregnancy: Secondary | ICD-10-CM | POA: Insufficient documentation

## 2014-06-18 DIAGNOSIS — O99213 Obesity complicating pregnancy, third trimester: Secondary | ICD-10-CM | POA: Insufficient documentation

## 2014-06-30 ENCOUNTER — Encounter: Payer: Self-pay | Admitting: Advanced Practice Midwife

## 2014-06-30 ENCOUNTER — Ambulatory Visit (INDEPENDENT_AMBULATORY_CARE_PROVIDER_SITE_OTHER): Payer: Medicaid Other | Admitting: Advanced Practice Midwife

## 2014-06-30 VITALS — BP 107/68 | HR 86 | Wt 204.5 lb

## 2014-06-30 DIAGNOSIS — Z3A31 31 weeks gestation of pregnancy: Secondary | ICD-10-CM

## 2014-06-30 DIAGNOSIS — O99613 Diseases of the digestive system complicating pregnancy, third trimester: Secondary | ICD-10-CM

## 2014-06-30 DIAGNOSIS — Z3493 Encounter for supervision of normal pregnancy, unspecified, third trimester: Secondary | ICD-10-CM

## 2014-06-30 DIAGNOSIS — R102 Pelvic and perineal pain: Secondary | ICD-10-CM

## 2014-06-30 DIAGNOSIS — K219 Gastro-esophageal reflux disease without esophagitis: Secondary | ICD-10-CM

## 2014-06-30 DIAGNOSIS — O26899 Other specified pregnancy related conditions, unspecified trimester: Secondary | ICD-10-CM

## 2014-06-30 DIAGNOSIS — O9989 Other specified diseases and conditions complicating pregnancy, childbirth and the puerperium: Secondary | ICD-10-CM

## 2014-06-30 LAB — POCT URINALYSIS DIP (DEVICE)
BILIRUBIN URINE: NEGATIVE
Glucose, UA: NEGATIVE mg/dL
KETONES UR: NEGATIVE mg/dL
Nitrite: NEGATIVE
Protein, ur: NEGATIVE mg/dL
Specific Gravity, Urine: 1.025 (ref 1.005–1.030)
Urobilinogen, UA: 0.2 mg/dL (ref 0.0–1.0)
pH: 5.5 (ref 5.0–8.0)

## 2014-06-30 NOTE — Patient Instructions (Signed)
Zantac 150 mg twice a day  Heartburn During Pregnancy  Heartburn is a burning sensation in the chest caused by stomach acid backing up into the esophagus. Heartburn is common in pregnancy because a certain hormone (progesterone) is released when a woman is pregnant. The progesterone hormone may relax the valve that separates the esophagus from the stomach. This allows acid to go up into the esophagus, causing heartburn. Heartburn may also happen in pregnancy because the enlarging uterus pushes up on the stomach, which pushes more acid into the esophagus. This is especially true in the later stages of pregnancy. Heartburn problems usually go away after giving birth. CAUSES  Heartburn is caused by stomach acid backing up into the esophagus. During pregnancy, this may result from various things, including:   The progesterone hormone.  Changing hormone levels.  The growing uterus pushing stomach acid upward.  Large meals.  Certain foods and drinks.  Exercise.  Increased acid production. SIGNS AND SYMPTOMS   Burning pain in the chest or lower throat.  Bitter taste in the mouth.  Coughing. DIAGNOSIS  Your health care provider will typically diagnose heartburn by taking a careful history of your concern. Blood tests may be done to check for a certain type of bacteria that is associated with heartburn. Sometimes, heartburn is diagnosed by prescribing a heartburn medicine to see if the symptoms improve. In some cases, a procedure called an endoscopy may be done. In this procedure, a tube with a light and a camera on the end (endoscope) is used to examine the esophagus and the stomach. TREATMENT  Treatment will vary depending on the severity of your symptoms. Your health care provider may recommend:  Over-the-counter medicines (antacids, acid reducers) for mild heartburn.  Prescription medicines to decrease stomach acid or to protect your stomach lining.  Certain changes in your  diet.  Elevating the head of your bed by putting blocks under the legs. This helps prevent stomach acid from backing up into the esophagus when you are lying down. HOME CARE INSTRUCTIONS   Only take over-the-counter or prescription medicines as directed by your health care provider.  Raise the head of your bed by putting blocks under the legs if instructed to do so by your health care provider. Sleeping with more pillows is not effective because it only changes the position of your head.  Do not exercise right after eating.  Avoid eating 2-3 hours before bed. Do not lie down right after eating.  Eat small meals throughout the day instead of three large meals.  Identify foods and beverages that make your symptoms worse and avoid them. Foods you may want to avoid include:  Peppers.  Chocolate.  High-fat foods, including fried foods.  Spicy foods.  Garlic and onions.  Citrus fruits, including oranges, grapefruit, lemons, and limes.  Food containing tomatoes or tomato products.  Mint.  Carbonated and caffeinated drinks.  Vinegar. SEEK MEDICAL CARE IF:  You have abdominal pain of any kind.  You feel burning in your upper abdomen or chest, especially after eating or lying down.  You have nausea and vomiting.  Your stomach feels upset after you eat. SEEK IMMEDIATE MEDICAL CARE IF:   You have severe chest pain that goes down your arm or into your jaw or neck.  You feel sweaty, dizzy, or light-headed.  You become short of breath.  You vomit blood.  You have difficulty or pain with swallowing.  You have bloody or black, tarry stools.  You have episodes  of heartburn more than 3 times a week, for more than 2 weeks. MAKE SURE YOU:  Understand these instructions.  Will watch your condition.  Will get help right away if you are not doing well or get worse. Document Released: 04/27/2000 Document Revised: 05/05/2013 Document Reviewed: 12/17/2012 [facility] Patient  Information 2015 [place], [place]. This information is not intended to replace advice given to you by your health care provider. Make sure you discuss any questions you have with your health care provider.  Preterm Labor Information Preterm labor is when labor starts at less than 37 weeks of pregnancy. The normal length of a pregnancy is 39 to 41 weeks. CAUSES Often, there is no identifiable underlying cause as to why a woman goes into preterm labor. One of the most common known causes of preterm labor is infection. Infections of the uterus, cervix, vagina, amniotic sac, bladder, kidney, or even the lungs (pneumonia) can cause labor to start. Other suspected causes of preterm labor include:   Urogenital infections, such as yeast infections and bacterial vaginosis.   Uterine abnormalities (uterine shape, uterine septum, fibroids, or bleeding from the placenta).   A cervix that has been operated on (it may fail to stay closed).   Malformations in the fetus.   Multiple gestations (twins, triplets, and so on).   Breakage of the amniotic sac.  RISK FACTORS  Having a previous history of preterm labor.   Having premature rupture of membranes (PROM).   Having a placenta that covers the opening of the cervix (placenta previa).   Having a placenta that separates from the uterus (placental abruption).   Having a cervix that is too weak to hold the fetus in the uterus (incompetent cervix).   Having too much fluid in the amniotic sac (polyhydramnios).   Taking illegal drugs or smoking while pregnant.   Not gaining enough weight while pregnant.   Being younger than 74 and older than 27 years old.   Having a low socioeconomic status.   Being African American. SYMPTOMS Signs and symptoms of preterm labor include:   Menstrual-like cramps, abdominal pain, or back pain.  Uterine contractions that are regular, as frequent as six in an hour, regardless of their intensity (may  be mild or painful).  Contractions that start on the top of the uterus and spread down to the lower abdomen and back.   A sense of increased pelvic pressure.   A watery or bloody mucus discharge that comes from the vagina.  TREATMENT Depending on the length of the pregnancy and other circumstances, your health care provider may suggest bed rest. If necessary, there are medicines that can be given to stop contractions and to mature the fetal lungs. If labor happens before 34 weeks of pregnancy, a prolonged hospital stay may be recommended. Treatment depends on the condition of both you and the fetus.  WHAT SHOULD YOU DO IF YOU THINK YOU ARE IN PRETERM LABOR? Call your health care provider right away. You will need to go to the hospital to get checked immediately. HOW CAN YOU PREVENT PRETERM LABOR IN FUTURE PREGNANCIES? You should:   Stop smoking if you smoke.  Maintain healthy weight gain and avoid chemicals and drugs that are not necessary.  Be watchful for any type of infection.  Inform your health care provider if you have a known history of preterm labor. Document Released: 07/21/2003 Document Revised: 12/31/2012 Document Reviewed: 06/02/2012 [facility] Patient Information 2015 [place], [place]. This information is not intended to replace advice  given to you by your health care provider. Make sure you discuss any questions you have with your health care provider.  

## 2014-06-30 NOTE — Progress Notes (Signed)
Groin tenderness. Rec maternity support belt. Partial relief of heartburn w/ Zantac 75. Rec 150 mg BID on schedule.  BTL consent signed. Interested in [employer]. Encourage to take class ASAP. Brief discussion of logistics, supplies.

## 2014-07-05 ENCOUNTER — Encounter: Payer: Self-pay | Admitting: *Deleted

## 2014-07-14 ENCOUNTER — Encounter: Payer: Medicaid Other | Admitting: Obstetrics and Gynecology

## 2014-07-22 ENCOUNTER — Ambulatory Visit (INDEPENDENT_AMBULATORY_CARE_PROVIDER_SITE_OTHER): Payer: Medicaid Other | Admitting: Family Medicine

## 2014-07-22 VITALS — BP 107/58 | HR 91 | Temp 98.0°F | Wt 201.4 lb

## 2014-07-22 DIAGNOSIS — Z3493 Encounter for supervision of normal pregnancy, unspecified, third trimester: Secondary | ICD-10-CM

## 2014-07-22 NOTE — Progress Notes (Signed)
Patient is 29 y.o. [id number].  +FM, denies LOF, VB, contractions, vaginal discharge.  Overall feeling well.

## 2014-07-23 LAB — POCT URINALYSIS DIP (DEVICE)
Bilirubin Urine: NEGATIVE
Glucose, UA: NEGATIVE mg/dL
Hgb urine dipstick: NEGATIVE
Ketones, ur: NEGATIVE mg/dL
NITRITE: NEGATIVE
PH: 6 (ref 5.0–8.0)
PROTEIN: NEGATIVE mg/dL
Specific Gravity, Urine: 1.015 (ref 1.005–1.030)
UROBILINOGEN UA: 1 mg/dL (ref 0.0–1.0)

## 2014-08-05 ENCOUNTER — Other Ambulatory Visit: Payer: Self-pay | Admitting: Family Medicine

## 2014-08-05 ENCOUNTER — Ambulatory Visit (INDEPENDENT_AMBULATORY_CARE_PROVIDER_SITE_OTHER): Payer: Medicaid Other | Admitting: Family Medicine

## 2014-08-05 VITALS — BP 96/54 | HR 90 | Temp 98.0°F | Wt 201.9 lb

## 2014-08-05 DIAGNOSIS — Z3493 Encounter for supervision of normal pregnancy, unspecified, third trimester: Secondary | ICD-10-CM

## 2014-08-05 LAB — POCT URINALYSIS DIP (DEVICE)
BILIRUBIN URINE: NEGATIVE
GLUCOSE, UA: NEGATIVE mg/dL
Hgb urine dipstick: NEGATIVE
KETONES UR: NEGATIVE mg/dL
Nitrite: NEGATIVE
Protein, ur: NEGATIVE mg/dL
SPECIFIC GRAVITY, URINE: 1.02 (ref 1.005–1.030)
UROBILINOGEN UA: 0.2 mg/dL (ref 0.0–1.0)
pH: 5.5 (ref 5.0–8.0)

## 2014-08-05 LAB — OB RESULTS CONSOLE GBS: STREP GROUP B AG: POSITIVE

## 2014-08-05 NOTE — Progress Notes (Signed)
Patient is 29 y.o. [id number].  +FM, denies LOF, VB, contractions, vaginal discharge.  Overall feeling well. - GBS and G/C today

## 2014-08-06 LAB — GC/CHLAMYDIA PROBE AMP
CT Probe RNA: NEGATIVE
GC Probe RNA: NEGATIVE

## 2014-08-06 LAB — CULTURE, BETA STREP (GROUP B ONLY)

## 2014-08-12 ENCOUNTER — Encounter: Payer: Self-pay | Admitting: Obstetrics and Gynecology

## 2014-08-12 ENCOUNTER — Ambulatory Visit (INDEPENDENT_AMBULATORY_CARE_PROVIDER_SITE_OTHER): Payer: Medicaid Other | Admitting: Obstetrics and Gynecology

## 2014-08-12 VITALS — BP 108/69 | HR 86 | Temp 97.8°F | Wt 202.8 lb

## 2014-08-12 DIAGNOSIS — Z3493 Encounter for supervision of normal pregnancy, unspecified, third trimester: Secondary | ICD-10-CM

## 2014-08-12 DIAGNOSIS — O26843 Uterine size-date discrepancy, third trimester: Secondary | ICD-10-CM

## 2014-08-12 DIAGNOSIS — O26849 Uterine size-date discrepancy, unspecified trimester: Secondary | ICD-10-CM | POA: Insufficient documentation

## 2014-08-12 LAB — POCT URINALYSIS DIP (DEVICE)
Bilirubin Urine: NEGATIVE
GLUCOSE, UA: NEGATIVE mg/dL
Hgb urine dipstick: NEGATIVE
Leukocytes, UA: NEGATIVE
Nitrite: NEGATIVE
PH: 5 (ref 5.0–8.0)
Protein, ur: NEGATIVE mg/dL
Specific Gravity, Urine: 1.025 (ref 1.005–1.030)
UROBILINOGEN UA: 1 mg/dL (ref 0.0–1.0)

## 2014-08-12 NOTE — Progress Notes (Signed)
U/S for s>d 08/13/14 @ 815a with Radiology.

## 2014-08-12 NOTE — Progress Notes (Signed)
Trace ketones in urine

## 2014-08-12 NOTE — Progress Notes (Signed)
RLP and late gestational discomforts discussed.  S>D will get growth scan.  Still unsure re: MOC. Advised increased fluids

## 2014-08-12 NOTE — Patient Instructions (Addendum)
Third Trimester of Pregnancy The third trimester is from week 29 through week 42, months 7 through 9. The third trimester is a time when the fetus is growing rapidly. At the end of the ninth month, the fetus is about 20 inches in length and weighs 6-10 pounds.  BODY CHANGES Your body goes through many changes during pregnancy. The changes vary from woman to woman.   Your weight will continue to increase. You can expect to gain 25-35 pounds (11-16 kg) by the end of the pregnancy.  You may begin to get stretch marks on your hips, abdomen, and breasts.  You may urinate more often because the fetus is moving lower into your pelvis and pressing on your bladder.  You may develop or continue to have heartburn as a result of your pregnancy.  You may develop constipation because certain hormones are causing the muscles that push waste through your intestines to slow down.  You may develop hemorrhoids or swollen, bulging veins (varicose veins).  You may have pelvic pain because of the weight gain and pregnancy hormones relaxing your joints between the bones in your pelvis. Backaches may result from overexertion of the muscles supporting your posture.  You may have changes in your hair. These can include thickening of your hair, rapid growth, and changes in texture. Some women also have hair loss during or after pregnancy, or hair that feels dry or thin. Your hair will most likely return to normal after your baby is born.  Your breasts will continue to grow and be tender. A yellow discharge may leak from your breasts called colostrum.  Your belly button may stick out.  You may feel short of breath because of your expanding uterus.  You may notice the fetus "dropping," or moving lower in your abdomen.  You may have a bloody mucus discharge. This usually occurs a few days to a week before labor begins.  Your cervix becomes thin and soft (effaced) near your due date. WHAT TO EXPECT AT YOUR PRENATAL  EXAMS  You will have prenatal exams every 2 weeks until week 36. Then, you will have weekly prenatal exams. During a routine prenatal visit:  You will be weighed to make sure you and the fetus are growing normally.  Your blood pressure is taken.  Your abdomen will be measured to track your baby's growth.  The fetal heartbeat will be listened to.  Any test results from the previous visit will be discussed.  You may have a cervical check near your due date to see if you have effaced. At around 36 weeks, your caregiver will check your cervix. At the same time, your caregiver will also perform a test on the secretions of the vaginal tissue. This test is to determine if a type of bacteria, Group B streptococcus, is present. Your caregiver will explain this further. Your caregiver may ask you:  What your birth plan is.  How you are feeling.  If you are feeling the baby move.  If you have had any abnormal symptoms, such as leaking fluid, bleeding, severe headaches, or abdominal cramping.  If you have any questions. Other tests or screenings that may be performed during your third trimester include:  Blood tests that check for low iron levels (anemia).  Fetal testing to check the health, activity level, and growth of the fetus. Testing is done if you have certain medical conditions or if there are problems during the pregnancy. FALSE LABOR You may feel small, irregular contractions that   eventually go away. These are called Braxton Hicks contractions, or false labor. Contractions may last for hours, days, or even weeks before true labor sets in. If contractions come at regular intervals, intensify, or become painful, it is best to be seen by your caregiver.  SIGNS OF LABOR   Menstrual-like cramps.  Contractions that are 5 minutes apart or less.  Contractions that start on the top of the uterus and spread down to the lower abdomen and back.  A sense of increased pelvic pressure or back  pain.  A watery or bloody mucus discharge that comes from the vagina. If you have any of these signs before the 37th week of pregnancy, call your caregiver right away. You need to go to the hospital to get checked immediately. HOME CARE INSTRUCTIONS   Avoid all smoking, herbs, alcohol, and unprescribed drugs. These chemicals affect the formation and growth of the baby.  Follow your caregiver's instructions regarding medicine use. There are medicines that are either safe or unsafe to take during pregnancy.  Exercise only as directed by your caregiver. Experiencing uterine cramps is a good sign to stop exercising.  Continue to eat regular, healthy meals.  Wear a good support bra for breast tenderness.  Do not use hot tubs, steam rooms, or saunas.  Wear your seat belt at all times when driving.  Avoid raw meat, uncooked cheese, cat litter boxes, and soil used by cats. These carry germs that can cause birth defects in the baby.  Take your prenatal vitamins.  Try taking a stool softener (if your caregiver approves) if you develop constipation. Eat more high-fiber foods, such as fresh vegetables or fruit and whole grains. Drink plenty of fluids to keep your urine clear or pale yellow.  Take warm sitz baths to soothe any pain or discomfort caused by hemorrhoids. Use hemorrhoid cream if your caregiver approves.  If you develop varicose veins, wear support hose. Elevate your feet for 15 minutes, 3-4 times a day. Limit salt in your diet.  Avoid heavy lifting, wear low heal shoes, and practice good posture.  Rest a lot with your legs elevated if you have leg cramps or low back pain.  Visit your dentist if you have not gone during your pregnancy. Use a soft toothbrush to brush your teeth and be gentle when you floss.  A sexual relationship may be continued unless your caregiver directs you otherwise.  Do not travel far distances unless it is absolutely necessary and only with the approval  of your caregiver.  Take prenatal classes to understand, practice, and ask questions about the labor and delivery.  Make a trial run to the hospital.  Pack your hospital bag.  Prepare the baby's nursery.  Continue to go to all your prenatal visits as directed by your caregiver. SEEK MEDICAL CARE IF:  You are unsure if you are in labor or if your water has broken.  You have dizziness.  You have mild pelvic cramps, pelvic pressure, or nagging pain in your abdominal area.  You have persistent nausea, vomiting, or diarrhea.  You have a bad smelling vaginal discharge.  You have pain with urination. SEEK IMMEDIATE MEDICAL CARE IF:   You have a fever.  You are leaking fluid from your vagina.  You have spotting or bleeding from your vagina.  You have severe abdominal cramping or pain.  You have rapid weight loss or gain.  You have shortness of breath with chest pain.  You notice sudden or extreme swelling   of your face, hands, ankles, feet, or legs.  You have not felt your baby move in over an hour.  You have severe headaches that do not go away with medicine.  You have vision changes. Document Released: 04/24/2001 Document Revised: 05/05/2013 Document Reviewed: 07/01/2012 [facility] Patient Information 2015 [place], [place]. This information is not intended to replace advice given to you by your health care provider. Make sure you discuss any questions you have with your health care provider. Round Ligament Pain During Pregnancy   Round ligament pain is a sharp pain or jabbing feeling often felt in the lower belly or groin area on one or both sides. It is one of the most common complaints during pregnancy and is considered a normal part of pregnancy. It is most often felt during the second trimester.   Here is what you need to know about round ligament pain, including some tips to help you feel better.   Causes of Round Ligament Pain   Several thick ligaments surround and  support your womb (uterus) as it grows during pregnancy. One of them is called the round ligament.   The round ligament connects the front part of the womb to your groin, the area where your legs attach to your pelvis. The round ligament normally tightens and relaxes slowly.   As your baby and womb grow, the round ligament stretches. That makes it more likely to become strained.   Sudden movements can cause the ligament to tighten quickly, like a rubber band snapping. This causes a sudden and quick jabbing feeling.   Symptoms of Round Ligament Pain   Round ligament pain can be concerning and uncomfortable. But it is considered normal as your body changes during pregnancy.   The symptoms of round ligament pain include a sharp, sudden spasm in the belly. It usually affects the right side, but it may happen on both sides. The pain only lasts a few seconds.   Exercise may cause the pain, as will rapid movements such as:  sneezing  coughing  laughing  rolling over in bed  standing up too quickly   Treatment of Round Ligament Pain   Here are some tips that may help reduce your discomfort:   Pain relief. Take over-the-counter acetaminophen for pain, if necessary. Ask your doctor if this is OK.   Exercise. Get plenty of exercise to keep your stomach (core) muscles strong. Doing stretching exercises or prenatal yoga can be helpful. Ask your doctor which exercises are safe for you and your baby.   A helpful exercise involves putting your hands and knees on the floor, lowering your head, and pushing your backside into the air.   Avoid sudden movements. Change positions slowly (such as standing up or sitting down) to avoid sudden movements that may cause stretching and pain.   Flex your hips. Bend and flex your hips before you cough, sneeze, or laugh to avoid pulling on the ligaments.   Apply warmth. A heating pad or warm bath may be helpful. Ask your doctor if this is OK. Extreme heat can be  dangerous to the baby.   You should try to modify your daily activity level and avoid positions that may worsen the condition.   When to Call the Doctor/Midwife   Always tell your doctor or midwife about any type of pain you have during pregnancy. Round ligament pain is quick and doesn't last long.   Call your health care provider immediately if you have:  severe pain  fever  chills  pain on urination  difficulty walking   Belly pain during pregnancy can be due to many different causes. It is important for your doctor to rule out more serious conditions, including pregnancy complications such as placenta abruption or non-pregnancy illnesses such as:  inguinal hernia  appendicitis  stomach, liver, and kidney problems  Preterm labor pains may sometimes be mistaken for round ligament pain.     

## 2014-08-13 ENCOUNTER — Ambulatory Visit (HOSPITAL_COMMUNITY)
Admission: RE | Admit: 2014-08-13 | Discharge: 2014-08-13 | Disposition: A | Discharge status: Home / Self Care | Payer: Medicaid Other | Source: Ambulatory Visit | Attending: Obstetrics and Gynecology | Admitting: Obstetrics and Gynecology

## 2014-08-13 DIAGNOSIS — O26843 Uterine size-date discrepancy, third trimester: Secondary | ICD-10-CM | POA: Diagnosis not present

## 2014-08-13 DIAGNOSIS — O99210 Obesity complicating pregnancy, unspecified trimester: Secondary | ICD-10-CM | POA: Insufficient documentation

## 2014-08-13 DIAGNOSIS — Z3A37 37 weeks gestation of pregnancy: Secondary | ICD-10-CM | POA: Insufficient documentation

## 2014-08-19 ENCOUNTER — Ambulatory Visit (INDEPENDENT_AMBULATORY_CARE_PROVIDER_SITE_OTHER): Payer: Medicaid Other | Admitting: Physician Assistant

## 2014-08-19 ENCOUNTER — Encounter: Payer: Self-pay | Admitting: Physician Assistant

## 2014-08-19 VITALS — BP 107/68 | HR 83 | Temp 98.0°F | Wt 199.7 lb

## 2014-08-19 DIAGNOSIS — O368131 Decreased fetal movements, third trimester, fetus 1: Secondary | ICD-10-CM | POA: Diagnosis present

## 2014-08-19 LAB — POCT URINALYSIS DIP (DEVICE)
Bilirubin Urine: NEGATIVE
GLUCOSE, UA: NEGATIVE mg/dL
Hgb urine dipstick: NEGATIVE
KETONES UR: NEGATIVE mg/dL
NITRITE: NEGATIVE
Protein, ur: NEGATIVE mg/dL
Specific Gravity, Urine: 1.015 (ref 1.005–1.030)
Urobilinogen, UA: 1 mg/dL (ref 0.0–1.0)
pH: 5.5 (ref 5.0–8.0)

## 2014-08-19 NOTE — Progress Notes (Signed)
Reports decrease in fetal movement-- states felt baby move twice today and only a few times yesterday.  C/o "alot of pain and a a lot of pressure." Patient has not left urine yet.

## 2014-08-19 NOTE — Patient Instructions (Signed)
Pain Relief During Labor and Delivery Everyone experiences pain differently, but labor causes severe pain for many women. The amount of pain you experience during labor and delivery depends on your pain tolerance, contraction strength, and your baby's size and position. There are many ways to prepare for and deal with the pain, including:   Taking prenatal classes to learn about labor and delivery. The more informed you are, the less anxious and afraid you may be. This can help lessen the pain.  Taking pain-relieving medicine during labor and delivery.  Learning breathing and relaxation techniques.  Taking a shower or bath.  Getting massaged.  Changing positions.  Placing an ice pack on your back. Discuss your pain control options with your health care provider during your prenatal visits.  WHAT ARE THE TWO TYPES OF PAIN-RELIEVING MEDICINES? 1. Analgesics. These are medicines that decrease pain without total loss of feeling or muscle movement. 2. Anesthetics. These are medicines that block all feeling, including pain. There can be minor side effects of both types, such as nausea, trouble concentrating, becoming sleepy, and lowering the heart rate of the baby. However, health care providers are careful to give doses that will not seriously affect the baby.  WHAT ARE THE SPECIFIC TYPES OF ANALGESICS AND ANESTHETICS? Systemic Analgesic Systemic pain medicines affect your whole body rather than focusing pain relief on the area of your body experiencing pain. This type of medicine is given either through an IV tube in your vein or by a shot (injection) into your muscle. This medicine will lessen your pain but will not stop it completely. It may also make you sleepy, but it will not make you lose consciousness.  Local Anesthetic Local anesthetic isused tonumb a small area of your body. The medicine is injected into the area of nerves that carry feeling to the vagina, vulva, or the area between  the vagina and anus (perineum).  General Anesthetic This type of medicine causes you to lose consciousness so you do not feel pain. It is usually used only in emergency situations during labor. It is given through an IV tube or face mask. Paracervical Block A paracervical block is a form of local anesthesia given during labor. Numbing medicine is injected into the right and left sides of the cervix and vagina. It helps to lessen the pain caused by contractions and stretching of the cervix. It may have to be given more than once.  Pudendal Block A pudendal block is another form of local anesthesia. It is used to relieve the pain associated with pushing or stretching of the perineum at the time of delivery. An injection is given deep through the vaginal wall into the pudendal nerve in the pelvis, numbing the perineum.  Epidural Anesthetic An epidural is an injection of numbing medicine given in the lower back and into the epidural space near your spinal cord. The epidural numbs the lower half of your body. You may be able to move your legs but will not be allowed to walk. Epidurals can be used for labor, delivery, or cesarean deliveries.  To prevent the medicine from wearing off, a small tube (catheter) may be threaded into the epidural space and taped in place to prevent it from slipping out. Medicine can then be given continuously in small doses through the tube until you deliver. Spinal Block A spinal block is similar to an epidural, but the medicine is injected into the spinal fluid, not the epidural space. A spinal block is only given   once. It starts to relieve pain quickly but lasts only 1-2 hours. Spinal blocks can also be used for cesarean deliveries.  Combined Spinal-Epidural Block Combined spinal-epidural blocks combine the benefits of both the spinal and epidural blocks. The spinal part acts quickly to relieve pain and the epidural provides continuous pain relief. Hydrotherapy Immersion in  warm water during labor may provide comfort and relaxation. It may also help to lessen pain, the use of anesthesia, and the length of labor. However, immersion in water during the delivery (water birth) may have some risk involved and studies to determine safety and risks are ongoing. If you are a healthy woman who is expecting an uncomplicated birth, talk with your health care provider to see if water birth is an option for you.  Document Released: 08/16/2008 Document Revised: 05/05/2013 Document Reviewed: 09/18/2012 [employer] Patient Information 2015 [employer]. This information is not intended to replace advice given to you by your health care provider. Make sure you discuss any questions you have with your health care provider.  

## 2014-08-19 NOTE — Progress Notes (Signed)
38 weeks, c/o decreased fetal movement x 2 days.  Denies LOF, vag bleeding, dysuria.   NST today RTC 1 week Vertex presentation per US exam - Diane Day [facility]

## 2014-08-27 ENCOUNTER — Ambulatory Visit (INDEPENDENT_AMBULATORY_CARE_PROVIDER_SITE_OTHER): Payer: Medicaid Other | Admitting: Certified Nurse Midwife

## 2014-08-27 VITALS — BP 118/73 | HR 78 | Temp 97.9°F | Wt 202.4 lb

## 2014-08-27 DIAGNOSIS — Z3483 Encounter for supervision of other normal pregnancy, third trimester: Secondary | ICD-10-CM

## 2014-08-27 LAB — POCT URINALYSIS DIP (DEVICE)
Bilirubin Urine: NEGATIVE
Glucose, UA: NEGATIVE mg/dL
Hgb urine dipstick: NEGATIVE
Ketones, ur: NEGATIVE mg/dL
Nitrite: NEGATIVE
Protein, ur: NEGATIVE mg/dL
Specific Gravity, Urine: 1.015 (ref 1.005–1.030)
Urobilinogen, UA: 0.2 mg/dL (ref 0.0–1.0)
pH: 5.5 (ref 5.0–8.0)

## 2014-08-27 NOTE — Patient Instructions (Signed)
Third Trimester of Pregnancy The third trimester is from week 29 through week 42, months 7 through 9. The third trimester is a time when the fetus is growing rapidly. At the end of the ninth month, the fetus is about 20 inches in length and weighs 6-10 pounds.  BODY CHANGES Your body goes through many changes during pregnancy. The changes vary from woman to woman.   Your weight will continue to increase. You can expect to gain 25-35 pounds (11-16 kg) by the end of the pregnancy.  You may begin to get stretch marks on your hips, abdomen, and breasts.  You may urinate more often because the fetus is moving lower into your pelvis and pressing on your bladder.  You may develop or continue to have heartburn as a result of your pregnancy.  You may develop constipation because certain hormones are causing the muscles that push waste through your intestines to slow down.  You may develop hemorrhoids or swollen, bulging veins (varicose veins).  You may have pelvic pain because of the weight gain and pregnancy hormones relaxing your joints between the bones in your pelvis. Backaches may result from overexertion of the muscles supporting your posture.  You may have changes in your hair. These can include thickening of your hair, rapid growth, and changes in texture. Some women also have hair loss during or after pregnancy, or hair that feels dry or thin. Your hair will most likely return to normal after your baby is born.  Your breasts will continue to grow and be tender. A yellow discharge may leak from your breasts called colostrum.  Your belly button may stick out.  You may feel short of breath because of your expanding uterus.  You may notice the fetus "dropping," or moving lower in your abdomen.  You may have a bloody mucus discharge. This usually occurs a few days to a week before labor begins.  Your cervix becomes thin and soft (effaced) near your due date. WHAT TO EXPECT AT YOUR PRENATAL  EXAMS  You will have prenatal exams every 2 weeks until week 36. Then, you will have weekly prenatal exams. During a routine prenatal visit:  You will be weighed to make sure you and the fetus are growing normally.  Your blood pressure is taken.  Your abdomen will be measured to track your baby's growth.  The fetal heartbeat will be listened to.  Any test results from the previous visit will be discussed.  You may have a cervical check near your due date to see if you have effaced. At around 36 weeks, your caregiver will check your cervix. At the same time, your caregiver will also perform a test on the secretions of the vaginal tissue. This test is to determine if a type of bacteria, Group B streptococcus, is present. Your caregiver will explain this further. Your caregiver may ask you:  What your birth plan is.  How you are feeling.  If you are feeling the baby move.  If you have had any abnormal symptoms, such as leaking fluid, bleeding, severe headaches, or abdominal cramping.  If you have any questions. Other tests or screenings that may be performed during your third trimester include:  Blood tests that check for low iron levels (anemia).  Fetal testing to check the health, activity level, and growth of the fetus. Testing is done if you have certain medical conditions or if there are problems during the pregnancy. FALSE LABOR You may feel small, irregular contractions that   eventually go away. These are called Braxton Hicks contractions, or false labor. Contractions may last for hours, days, or even weeks before true labor sets in. If contractions come at regular intervals, intensify, or become painful, it is best to be seen by your caregiver.  SIGNS OF LABOR   Menstrual-like cramps.  Contractions that are 5 minutes apart or less.  Contractions that start on the top of the uterus and spread down to the lower abdomen and back.  A sense of increased pelvic pressure or back  pain.  A watery or bloody mucus discharge that comes from the vagina. If you have any of these signs before the 37th week of pregnancy, call your caregiver right away. You need to go to the hospital to get checked immediately. HOME CARE INSTRUCTIONS   Avoid all smoking, herbs, alcohol, and unprescribed drugs. These chemicals affect the formation and growth of the baby.  Follow your caregiver's instructions regarding medicine use. There are medicines that are either safe or unsafe to take during pregnancy.  Exercise only as directed by your caregiver. Experiencing uterine cramps is a good sign to stop exercising.  Continue to eat regular, healthy meals.  Wear a good support bra for breast tenderness.  Do not use hot tubs, steam rooms, or saunas.  Wear your seat belt at all times when driving.  Avoid raw meat, uncooked cheese, cat litter boxes, and soil used by cats. These carry germs that can cause birth defects in the baby.  Take your prenatal vitamins.  Try taking a stool softener (if your caregiver approves) if you develop constipation. Eat more high-fiber foods, such as fresh vegetables or fruit and whole grains. Drink plenty of fluids to keep your urine clear or pale yellow.  Take warm sitz baths to soothe any pain or discomfort caused by hemorrhoids. Use hemorrhoid cream if your caregiver approves.  If you develop varicose veins, wear support hose. Elevate your feet for 15 minutes, 3-4 times a day. Limit salt in your diet.  Avoid heavy lifting, wear low heal shoes, and practice good posture.  Rest a lot with your legs elevated if you have leg cramps or low back pain.  Visit your dentist if you have not gone during your pregnancy. Use a soft toothbrush to brush your teeth and be gentle when you floss.  A sexual relationship may be continued unless your caregiver directs you otherwise.  Do not travel far distances unless it is absolutely necessary and only with the approval  of your caregiver.  Take prenatal classes to understand, practice, and ask questions about the labor and delivery.  Make a trial run to the hospital.  Pack your hospital bag.  Prepare the baby's nursery.  Continue to go to all your prenatal visits as directed by your caregiver. SEEK MEDICAL CARE IF:  You are unsure if you are in labor or if your water has broken.  You have dizziness.  You have mild pelvic cramps, pelvic pressure, or nagging pain in your abdominal area.  You have persistent nausea, vomiting, or diarrhea.  You have a bad smelling vaginal discharge.  You have pain with urination. SEEK IMMEDIATE MEDICAL CARE IF:   You have a fever.  You are leaking fluid from your vagina.  You have spotting or bleeding from your vagina.  You have severe abdominal cramping or pain.  You have rapid weight loss or gain.  You have shortness of breath with chest pain.  You notice sudden or extreme swelling   of your face, hands, ankles, feet, or legs.  You have not felt your baby move in over an hour.  You have severe headaches that do not go away with medicine.  You have vision changes. Document Released: 04/24/2001 Document Revised: 05/05/2013 Document Reviewed: 07/01/2012 [facility] Patient Information 2015 [place], [place]. This information is not intended to replace advice given to you by your health care provider. Make sure you discuss any questions you have with your health care provider.    Sexual Intercourse is ok and may help facilitate labor

## 2014-08-27 NOTE — Progress Notes (Signed)
Doing Well. Scheduled induction for 41 weeks.

## 2014-08-27 NOTE — Progress Notes (Signed)
Leukocytes: small 

## 2014-08-27 NOTE — Progress Notes (Signed)
C/o "lots of pain and lots of pressure."  Has not given a urine yet.

## 2014-08-29 ENCOUNTER — Inpatient Hospital Stay (HOSPITAL_COMMUNITY)
Admission: AD | Admit: 2014-08-29 | Discharge: 2014-08-30 | DRG: 775 | Disposition: A | Discharge status: Home / Self Care | Payer: Medicaid Other | Source: Ambulatory Visit | Attending: Obstetrics and Gynecology | Admitting: Obstetrics and Gynecology

## 2014-08-29 ENCOUNTER — Inpatient Hospital Stay (HOSPITAL_COMMUNITY): Payer: Medicaid Other | Admitting: Anesthesiology

## 2014-08-29 ENCOUNTER — Inpatient Hospital Stay (HOSPITAL_COMMUNITY): Payer: Medicaid Other

## 2014-08-29 ENCOUNTER — Encounter (HOSPITAL_COMMUNITY): Payer: Self-pay | Admitting: *Deleted

## 2014-08-29 DIAGNOSIS — O26843 Uterine size-date discrepancy, third trimester: Secondary | ICD-10-CM | POA: Diagnosis present

## 2014-08-29 DIAGNOSIS — Z803 Family history of malignant neoplasm of breast: Secondary | ICD-10-CM

## 2014-08-29 DIAGNOSIS — O99824 Streptococcus B carrier state complicating childbirth: Secondary | ICD-10-CM | POA: Diagnosis present

## 2014-08-29 DIAGNOSIS — Z87891 Personal history of nicotine dependence: Secondary | ICD-10-CM

## 2014-08-29 DIAGNOSIS — Z3A40 40 weeks gestation of pregnancy: Secondary | ICD-10-CM | POA: Diagnosis present

## 2014-08-29 DIAGNOSIS — O288 Other abnormal findings on antenatal screening of mother: Secondary | ICD-10-CM | POA: Diagnosis present

## 2014-08-29 LAB — CBC
HEMATOCRIT: 36.8 % (ref 36.0–46.0)
HEMOGLOBIN: 12.8 g/dL (ref 12.0–15.0)
MCH: 24.9 pg — ABNORMAL LOW (ref 26.0–34.0)
MCHC: 34.8 g/dL (ref 30.0–36.0)
MCV: 71.5 fL — ABNORMAL LOW (ref 78.0–100.0)
Platelets: 238 10*3/uL (ref 150–400)
RBC: 5.15 MIL/uL — ABNORMAL HIGH (ref 3.87–5.11)
RDW: 15.6 % — AB (ref 11.5–15.5)
WBC: 13.8 10*3/uL — ABNORMAL HIGH (ref 4.0–10.5)

## 2014-08-29 LAB — RPR: RPR: NONREACTIVE

## 2014-08-29 LAB — TYPE AND SCREEN
ABO/RH(D): O POS
Antibody Screen: NEGATIVE

## 2014-08-29 LAB — ABO/RH: ABO/RH(D): O POS

## 2014-08-29 LAB — HIV ANTIBODY (ROUTINE TESTING W REFLEX): HIV SCREEN 4TH GENERATION: NONREACTIVE

## 2014-08-29 MED ORDER — LACTATED RINGERS IV SOLN
500.0000 mL | INTRAVENOUS | Status: DC | PRN
  Administered 2014-08-29 (×2): 500 mL via INTRAVENOUS

## 2014-08-29 MED ORDER — OXYTOCIN 40 UNITS IN LACTATED RINGERS INFUSION - SIMPLE MED
1.0000 m[IU]/min | INTRAVENOUS | Status: DC
  Administered 2014-08-29: 1 m[IU]/min via INTRAVENOUS

## 2014-08-29 MED ORDER — PHENYLEPHRINE 40 MCG/ML (10ML) SYRINGE FOR IV PUSH (FOR BLOOD PRESSURE SUPPORT)
80.0000 ug | PREFILLED_SYRINGE | INTRAVENOUS | Status: DC | PRN
  Filled 2014-08-29: qty 2

## 2014-08-29 MED ORDER — PHENYLEPHRINE 40 MCG/ML (10ML) SYRINGE FOR IV PUSH (FOR BLOOD PRESSURE SUPPORT)
80.0000 ug | PREFILLED_SYRINGE | INTRAVENOUS | Status: DC | PRN
  Filled 2014-08-29: qty 20
  Filled 2014-08-29: qty 2

## 2014-08-29 MED ORDER — ONDANSETRON HCL 4 MG/2ML IJ SOLN: 4.0000 mg | INTRAMUSCULAR | Status: DC | PRN

## 2014-08-29 MED ORDER — OXYCODONE-ACETAMINOPHEN 5-325 MG PO TABS: 1.0000 | ORAL_TABLET | ORAL | Status: DC | PRN

## 2014-08-29 MED ORDER — CITRIC ACID-SODIUM CITRATE 334-500 MG/5ML PO SOLN
30.0000 mL | ORAL | Status: DC | PRN
Start: 2014-08-29 — End: 2014-08-29
  Filled 2014-08-29: qty 15

## 2014-08-29 MED ORDER — OXYTOCIN 40 UNITS IN LACTATED RINGERS INFUSION - SIMPLE MED: 1.0000 m[IU]/min | INTRAVENOUS | Status: DC

## 2014-08-29 MED ORDER — PENICILLIN G POTASSIUM 5000000 UNITS IJ SOLR
5.0000 10*6.[IU] | Freq: Once | INTRAMUSCULAR | Status: AC
  Administered 2014-08-29: 5 10*6.[IU] via INTRAVENOUS
  Filled 2014-08-29: qty 5

## 2014-08-29 MED ORDER — FENTANYL CITRATE (PF) 100 MCG/2ML IJ SOLN
100.0000 ug | INTRAMUSCULAR | Status: DC | PRN
  Administered 2014-08-29 (×2): 100 ug via INTRAVENOUS
  Filled 2014-08-29 (×2): qty 2

## 2014-08-29 MED ORDER — TERBUTALINE SULFATE 1 MG/ML IJ SOLN
0.2500 mg | Freq: Once | INTRAMUSCULAR | Status: DC | PRN
  Filled 2014-08-29: qty 1

## 2014-08-29 MED ORDER — EPHEDRINE 5 MG/ML INJ
10.0000 mg | INTRAVENOUS | Status: DC | PRN
  Filled 2014-08-29: qty 2

## 2014-08-29 MED ORDER — OXYCODONE-ACETAMINOPHEN 5-325 MG PO TABS: 2.0000 | ORAL_TABLET | ORAL | Status: DC | PRN

## 2014-08-29 MED ORDER — BENZOCAINE-MENTHOL 20-0.5 % EX AERO
1.0000 "application " | INHALATION_SPRAY | CUTANEOUS | Status: DC | PRN
  Administered 2014-08-29: 1 via TOPICAL
  Filled 2014-08-29: qty 56

## 2014-08-29 MED ORDER — DIPHENHYDRAMINE HCL 50 MG/ML IJ SOLN: 12.5000 mg | INTRAMUSCULAR | Status: DC | PRN

## 2014-08-29 MED ORDER — DIBUCAINE 1 % RE OINT
1.0000 | TOPICAL_OINTMENT | RECTAL | Status: DC | PRN
Start: 2014-08-29 — End: 2014-08-30

## 2014-08-29 MED ORDER — OXYTOCIN 40 UNITS IN LACTATED RINGERS INFUSION - SIMPLE MED
62.5000 mL/h | INTRAVENOUS | Status: DC
  Administered 2014-08-29: 500 mL/h via INTRAVENOUS

## 2014-08-29 MED ORDER — LACTATED RINGERS IV SOLN
500.0000 mL | Freq: Once | INTRAVENOUS | Status: AC
  Administered 2014-08-29: 500 mL via INTRAVENOUS

## 2014-08-29 MED ORDER — SENNOSIDES-DOCUSATE SODIUM 8.6-50 MG PO TABS
2.0000 | ORAL_TABLET | ORAL | Status: DC
  Administered 2014-08-30: 2 via ORAL
  Filled 2014-08-29: qty 2

## 2014-08-29 MED ORDER — PENICILLIN G POTASSIUM 5000000 UNITS IJ SOLR
2.5000 10*6.[IU] | INTRAVENOUS | Status: DC
  Administered 2014-08-29: 2.5 10*6.[IU] via INTRAVENOUS
  Filled 2014-08-29 (×5): qty 2.5

## 2014-08-29 MED ORDER — OXYTOCIN 40 UNITS IN LACTATED RINGERS INFUSION - SIMPLE MED
INTRAVENOUS | Status: AC
  Administered 2014-08-29: 1 m[IU]/min via INTRAVENOUS
  Filled 2014-08-29: qty 1000

## 2014-08-29 MED ORDER — OXYTOCIN 40 UNITS IN LACTATED RINGERS INFUSION - SIMPLE MED
62.5000 mL/h | INTRAVENOUS | Status: DC | PRN
Start: 2014-08-29 — End: 2014-08-30

## 2014-08-29 MED ORDER — ZOLPIDEM TARTRATE 5 MG PO TABS: 5.0000 mg | ORAL_TABLET | Freq: Every evening | ORAL | Status: DC | PRN

## 2014-08-29 MED ORDER — LANOLIN HYDROUS EX OINT: TOPICAL_OINTMENT | CUTANEOUS | Status: DC | PRN

## 2014-08-29 MED ORDER — LIDOCAINE HCL (PF) 1 % IJ SOLN
30.0000 mL | INTRAMUSCULAR | Status: DC | PRN
  Filled 2014-08-29: qty 30

## 2014-08-29 MED ORDER — ONDANSETRON HCL 4 MG PO TABS
4.0000 mg | ORAL_TABLET | ORAL | Status: DC | PRN
Start: 2014-08-29 — End: 2014-08-30

## 2014-08-29 MED ORDER — OXYCODONE-ACETAMINOPHEN 5-325 MG PO TABS
2.0000 | ORAL_TABLET | ORAL | Status: DC | PRN
  Administered 2014-08-29 – 2014-08-30 (×2): 2 via ORAL
  Filled 2014-08-29 (×2): qty 2

## 2014-08-29 MED ORDER — OXYTOCIN BOLUS FROM INFUSION: 500.0000 mL | INTRAVENOUS | Status: DC

## 2014-08-29 MED ORDER — PRENATAL MULTIVITAMIN CH
1.0000 | ORAL_TABLET | Freq: Every day | ORAL | Status: DC
  Administered 2014-08-30: 1 via ORAL
  Filled 2014-08-29: qty 1

## 2014-08-29 MED ORDER — IBUPROFEN 600 MG PO TABS
600.0000 mg | ORAL_TABLET | Freq: Four times a day (QID) | ORAL | Status: DC
  Administered 2014-08-29 – 2014-08-30 (×4): 600 mg via ORAL
  Filled 2014-08-29 (×4): qty 1

## 2014-08-29 MED ORDER — ONDANSETRON HCL 4 MG/2ML IJ SOLN
4.0000 mg | Freq: Four times a day (QID) | INTRAMUSCULAR | Status: DC | PRN
Start: 2014-08-29 — End: 2014-08-29

## 2014-08-29 MED ORDER — ACETAMINOPHEN 325 MG PO TABS: 650.0000 mg | ORAL_TABLET | ORAL | Status: DC | PRN

## 2014-08-29 MED ORDER — OXYTOCIN 40 UNITS IN LACTATED RINGERS INFUSION - SIMPLE MED
62.5000 mL/h | INTRAVENOUS | Status: DC
  Filled 2014-08-29: qty 1000

## 2014-08-29 MED ORDER — SIMETHICONE 80 MG PO CHEW: 80.0000 mg | CHEWABLE_TABLET | ORAL | Status: DC | PRN

## 2014-08-29 MED ORDER — LACTATED RINGERS IV SOLN
INTRAVENOUS | Status: DC
  Administered 2014-08-29: 11:00:00 via INTRAVENOUS

## 2014-08-29 MED ORDER — WITCH HAZEL-GLYCERIN EX PADS: 1.0000 "application " | MEDICATED_PAD | CUTANEOUS | Status: DC | PRN

## 2014-08-29 MED ORDER — FENTANYL 2.5 MCG/ML BUPIVACAINE 1/10 % EPIDURAL INFUSION (WH - ANES)
14.0000 mL/h | INTRAMUSCULAR | Status: DC | PRN
  Administered 2014-08-29 (×2): 14 mL/h via EPIDURAL
  Filled 2014-08-29: qty 125

## 2014-08-29 MED ORDER — MISOPROSTOL 200 MCG PO TABS
800.0000 ug | ORAL_TABLET | Freq: Once | ORAL | Status: AC
  Administered 2014-08-29: 800 ug via ORAL
  Filled 2014-08-29: qty 4

## 2014-08-29 MED ORDER — LIDOCAINE HCL (PF) 1 % IJ SOLN
INTRAMUSCULAR | Status: DC | PRN
  Administered 2014-08-29 (×2): 8 mL

## 2014-08-29 MED ORDER — DIPHENHYDRAMINE HCL 25 MG PO CAPS: 25.0000 mg | ORAL_CAPSULE | Freq: Four times a day (QID) | ORAL | Status: DC | PRN

## 2014-08-29 MED ORDER — TETANUS-DIPHTH-ACELL PERTUSSIS 5-2.5-18.5 LF-MCG/0.5 IM SUSP: 0.5000 mL | Freq: Once | INTRAMUSCULAR | Status: DC

## 2014-08-29 NOTE — [facility] Provider Note (Signed)
Patient to be admitted.  Please see admission note.

## 2014-08-29 NOTE — Progress Notes (Signed)
Jenna Tran is a 29 y.o. [id number] at 6778w1d admitted for SOL.  Subjective: Comfortable with epidural.  Objective: BP 106/62 mmHg  Pulse 79  Temp(Src) 97.7 F (36.5 C) (Oral)  Resp 18  Ht 5\' 2"  (1.575 m)  Wt 91.627 kg (202 lb)  BMI 36.94 kg/m2  SpO2 99%  LMP 11/21/2013 (Exact Date)    FHT:  FHR: 125 bpm, variability: moderate,  accelerations:  Abscent,  decelerations:  Present early UC:   regular  SVE:   Dilation: 5 Effacement (%): 100 Station: -1 Exam by:: Rosemarie AxL Samamtha Tiegs CNM student  Pitocin @ 8 mu/min  Labs: Lab Results  Component Value Date   WBC 13.8* 08/29/2014   HGB 12.8 08/29/2014   HCT 36.8 08/29/2014   MCV 71.5* 08/29/2014   PLT 238 08/29/2014    Assessment / Plan: Augmentation of labor, progressing well  Labor: Progressing normally Fetal Wellbeing:  Category II Pain Control:  Epidural Pre-eclampsia: no s/s I/D:  GBS positive, on PCN Anticipated MOD:  NSVD  Laporcha Marchesi Wynne SNM 08/29/2014, 1:16 PM

## 2014-08-29 NOTE — Progress Notes (Signed)
Patient ID: Jenna Tran, female   DOB: [birth year], 29 y.o.   MRN: [id number]  Jenna Framember Sperling is a 29 y.o. [id number] at 6680w1d admitted for spontaneous onset of labor.  Subjective: Reports mild discomfort with contractions.  Objective: BP 119/83 mmHg  Pulse 91  Temp(Src) 98.5 F (36.9 C)  Resp 20  Ht 5\' 2"  (1.575 m)  Wt 91.627 kg (202 lb)  BMI 36.94 kg/m2  SpO2 98%  LMP 11/21/2013 (Exact Date)    FHT:  FHR: 130 bpm, variability: moderate,  accelerations:  Present,  decelerations:  Absent UC:   irregular  SVE:   Dilation: 3 Effacement (%): 60 Station: -3 Exam by:: L Lamon RN  Pitocin @ 2 mu/min  Labs: Lab Results  Component Value Date   WBC 13.8* 08/29/2014   HGB 12.8 08/29/2014   HCT 36.8 08/29/2014   MCV 71.5* 08/29/2014   PLT 238 08/29/2014    Assessment / Plan: Protracted latent phase  Labor: Continue augmentation with Pitocin Fetal Wellbeing:  Category I Pain Control:  Fentanyl Pre-eclampsia: no s/s I/D:  GBS positive, receiving PCN Anticipated MOD:  NSVD  Ross,Lisa Wynne SNM 08/29/2014, 9:19 AM   D lawson CNM

## 2014-08-29 NOTE — MAU Note (Signed)
Pt is her for ctx, denies vb and lof. Drank castor oil at 2330.

## 2014-08-29 NOTE — Progress Notes (Signed)
Jenna Tran is a 29 y.o. [phone] at 6458w1d admitted for spontaneous onset of labor  Subjective: Comfortable with epidural.  Objective: BP 94/54 mmHg  Pulse 91  Temp(Src) 97.7 F (36.5 C) (Oral)  Resp 18  Ht 5\' 2"  (1.575 m)  Wt 91.627 kg (202 lb)  BMI 36.94 kg/m2  SpO2 99%  LMP 11/21/2013 (Exact Date)    FHT:  FHR: 130 bpm, variability: minimal ,  accelerations:  Abscent,  decelerations:  Present variable UC:   irregular  SVE:   Dilation: 3.5 Effacement (%): 70 Station: Ballotable, -2 Exam by:: L Lamon RN  Pitocin @ 5 mu/min  Labs: Lab Results  Component Value Date   WBC 13.8* 08/29/2014   HGB 12.8 08/29/2014   HCT 36.8 08/29/2014   MCV 71.5* 08/29/2014   PLT 238 08/29/2014    Assessment / Plan: Augmentation of labor, progressing well  Labor: progressing on pitocin Fetal Wellbeing:  Category II Pain Control:  Epidural Pre-eclampsia: no s/s I/D:  GBS positive, PCN Anticipated MOD:  NSVD  Ross,Lisa Wynne SNM 08/29/2014, 11:41 AM
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
## 2014-08-29 NOTE — Anesthesia Procedure Notes (Signed)
Epidural Patient location during procedure: OB Start time: 08/29/2014 10:08 AM End time: 08/29/2014 10:12 AM  Staffing Anesthesiologist: Leilani AbleHATCHETT, Brant Peets  Preanesthetic Checklist Completed: patient identified, surgical consent, pre-op evaluation, timeout performed, IV checked, risks and benefits discussed and monitors and equipment checked  Epidural Patient position: sitting Prep: site prepped and draped and DuraPrep Patient monitoring: continuous pulse ox and blood pressure Approach: midline Location: L4-L5 Injection technique: LOR air  Needle:  Needle type: Tuohy  Needle gauge: 17 G Needle length: 9 cm and 9 Needle insertion depth: 6 cm Catheter type: closed end flexible Catheter size: 19 Gauge Catheter at skin depth: 11 cm Test dose: negative and Other  Assessment Sensory level: T9 Events: blood not aspirated, injection not painful, no injection resistance, negative IV test and no paresthesia  Additional Notes Reason for block:surgical anesthesia

## 2014-08-29 NOTE — Lactation Note (Signed)
This note was copied from the chart of [place]. Lactation Consultation Note Initial visit at 7 hours of age.  Mom reports attempting feedings with 2 older children and then pumped and bottle fed so she plans to do that with this baby also.  Mom is aware of [place] assist available if she wants to work on latching baby to breast.  Mom has a personal DEBP with her and kit that she prefers to use.  Instructions on frequency, cleaning and storage discussed.  Encouraged mom to pump every 3 hours or 8 times in 24 hours to establish a good milk supply.  Discussed hand expression after pumping to yield increased volume.  Baby has had a bottle of 52ms and formula of 211m and mom reports baby was satisfied with each feeding.  Explained volume and stomach size for newborn.  [facility] resources given and discussed.  Early newborn behavior discussed.  Hand expression reported by mom with colostrum visible.  Mom to call for assist as needed.    Patient Name: Jenna AmMaudene Stotleroday's Date: 08/29/2014 Reason for consult: Initial assessment   Maternal Data Has patient been taught Hand Expression?: Yes Does the patient have breastfeeding experience prior to this delivery?: Yes  Feeding Feeding Type: Bottle Fed - Formula  LATCH Score/Interventions                      Lactation Tools Discussed/Used Tools: Pump Breast pump type: Double-Electric Breast Pump WIC Program: No Pump Review: Setup, frequency, and cleaning;Milk Storage Initiated by:: JS Date initiated:: 08/29/14   Consult Status Consult Status: PRN    ShJustice Britain/17/2016, 9:38 PM

## 2014-08-29 NOTE — Anesthesia Preprocedure Evaluation (Signed)
Anesthesia Evaluation  Patient identified by MRN, date of birth, ID band Patient awake    Reviewed: Allergy & Precautions, H&P , NPO status , Patient's Chart, lab work & pertinent test results  Airway Mallampati: II  TM Distance: >3 FB Neck ROM: full    Dental no notable dental hx.    Pulmonary neg pulmonary ROS, former smoker,    Pulmonary exam normal       Cardiovascular negative cardio ROS      Neuro/Psych negative neurological ROS  negative psych ROS   GI/Hepatic negative GI ROS, Neg liver ROS,   Endo/Other  negative endocrine ROS  Renal/GU negative Renal ROS     Musculoskeletal   Abdominal Normal abdominal exam  (+)   Peds  Hematology negative hematology ROS (+)   Anesthesia Other Findings   Reproductive/Obstetrics (+) Pregnancy                             Anesthesia Physical Anesthesia Plan  ASA: II  Anesthesia Plan: Epidural   Post-op Pain Management:    Induction:   Airway Management Planned:   Additional Equipment:   Intra-op Plan:   Post-operative Plan:   Informed Consent: I have reviewed the patients History and Physical, chart, labs and discussed the procedure including the risks, benefits and alternatives for the proposed anesthesia with the patient or authorized representative who has indicated his/her understanding and acceptance.     Plan Discussed with:   Anesthesia Plan Comments:         Anesthesia Quick Evaluation  

## 2014-08-30 ENCOUNTER — Encounter (HOSPITAL_COMMUNITY): Payer: Self-pay | Admitting: Anesthesiology

## 2014-08-30 LAB — CBC
HCT: 32.6 % — ABNORMAL LOW (ref 36.0–46.0)
Hemoglobin: 11.2 g/dL — ABNORMAL LOW (ref 12.0–15.0)
MCH: 24.8 pg — ABNORMAL LOW (ref 26.0–34.0)
MCHC: 34.4 g/dL (ref 30.0–36.0)
MCV: 72.1 fL — ABNORMAL LOW (ref 78.0–100.0)
PLATELETS: 203 10*3/uL (ref 150–400)
RBC: 4.52 MIL/uL (ref 3.87–5.11)
RDW: 15.7 % — ABNORMAL HIGH (ref 11.5–15.5)
WBC: 14.9 10*3/uL — AB (ref 4.0–10.5)

## 2014-08-30 MED ORDER — DOCUSATE SODIUM 100 MG PO CAPS: 100.0000 mg | ORAL_CAPSULE | Freq: Two times a day (BID) | ORAL | Status: DC

## 2014-08-30 MED ORDER — IBUPROFEN 600 MG PO TABS: 600.0000 mg | ORAL_TABLET | Freq: Four times a day (QID) | ORAL | Status: DC

## 2014-08-30 NOTE — Discharge Summary (Signed)
Obstetric Discharge Summary Reason for Admission: onset of labor Prenatal Procedures: NST and ultrasound Intrapartum Procedures: spontaneous vaginal delivery and GBS prophylaxis Postpartum Procedures: none Complications-Operative and Postpartum: 1st degree perineal laceration  Delivery Note At 2:02 PM a viable and healthy female was delivered via (Presentation: Left Occiput Anterior). APGAR: 9, 9; weight .  Placenta status: spontaneous, intact . Cord: 3 vessels with the following complications:none. Cord pH: n/a  Anesthesia: Epidural  Episiotomy: None Lacerations: 1st degree, hemostatic, not repaired Suture Repair: n/a Est. Blood Loss (mL): 150  Mom to postpartum. Baby to Couplet care / Skin to Skin.  Delivery supervised by Zerita Boersarlene Lawson, CNM  Felton Clintonoss,Lisa Wynne, SNM 08/29/2014, 2:54 PM  Hospital Course:  Active Problems:   NST (non-stress test) nonreactive   Jenna Framember Tran is a 29 y.o. [id number] s/p NSVD.  Patient was admitted on 4/17 after NRNST and BPP 2/8 in early labor. She progressed with pitocin and had uncomplicated NSVD. She has postpartum course that was uncomplicated including no problems with ambulating, PO intake, urination, pain, or bleeding. The pt feels ready to go home and will be discharged with outpatient follow-up.   Today: No acute events overnight.  Pt denies problems with ambulating, voiding or po intake.  She denies nausea or vomiting.  Pain is well controlled.  She has had flatus. She has had bowel movement.  Lochia Small.  Plan for birth control is  IUD.  Method of Feeding: breast + bottle  Physical Exam:  General: alert, cooperative and no distress Lochia: appropriate Uterine Fundus: firm DVT Evaluation: No evidence of DVT seen on physical exam.  H/H: Lab Results  Component Value Date/Time   HGB 11.2* 08/30/2014 05:40 AM   HCT 32.6* 08/30/2014 05:40 AM    Discharge Diagnoses: Term Pregnancy-delivered  Discharge Information: Date:  08/30/2014 Activity: pelvic rest Diet: routine  Medications: PNV, Ibuprofen and Colace Breast feeding:  Yes Condition: stable Instructions: refer to handout Discharge to: home      Medication List    TAKE these medications        docusate sodium 100 MG capsule  Commonly known as:  COLACE  Take 1 capsule (100 mg total) by mouth 2 (two) times daily.     ibuprofen 600 MG tablet  Commonly known as:  ADVIL,MOTRIN  Take 1 tablet (600 mg total) by mouth every 6 (six) hours.     omeprazole 20 MG capsule  Commonly known as:  PRILOSEC  Take 20 mg by mouth daily.     prenatal vitamin w/FE, FA 27-1 MG Tabs tablet  Take 1 tablet by mouth daily.           Follow-up Information    [facility] 6 weeks.   Why:  Postpartum visit   Contact information:   [address] [place] [phone]      William DaltonMcEachern, Jacier Gladu ,MD OB Fellow 08/30/2014,3:58 PM

## 2014-08-30 NOTE — Progress Notes (Signed)
Post Partum Day 1 Subjective: no complaints, up ad lib, voiding and tolerating PO  Objective: Blood pressure 107/61, pulse 78, temperature 97.8 F (36.6 C), temperature source Oral, resp. rate 16, height 5\' 2"  (1.575 m), weight 202 lb (91.627 kg), last menstrual period 11/21/2013, SpO2 99 %, unknown if currently breastfeeding.  Physical Exam:  General: alert, cooperative, appears stated age and no distress Lochia: appropriate Uterine Fundus: firm Incision: n/a DVT Evaluation: No evidence of DVT seen on physical exam. Negative Homan's sign. No cords or calf tenderness. No significant calf/ankle edema.   Recent Labs  08/29/14 0520 08/30/14 0540  HGB 12.8 11.2*  HCT 36.8 32.6*    Assessment/Plan: Plan for discharge tomorrow   LOS: 1 day   Jenna Tran DARLENE 08/30/2014, 6:58 AM

## 2014-08-30 NOTE — Progress Notes (Signed)
Clinical Social Work Department PSYCHOSOCIAL ASSESSMENT - MATERNAL/CHILD 08/30/2014  Patient:  Jenna Tran, Jenna Tran  Account Number:  [id number]  Admit Date:  08/29/2014  Marjo Bicker Name:   [name]"   Clinical Social Worker:  Loleta Books, CLINICAL SOCIAL WORKER   Date/Time:  08/30/2014 09:30 AM  Date Referred:  08/29/2014   Referral source  Central Nursery     Referred reason  Other - See comment   Other referral source:    I:  FAMILY / HOME ENVIRONMENT Child's legal guardian:  PARENT  Guardian - Name Guardian - Age Guardian - Address  Jenna Tran 28 [address] [name] [employer] [place], [place] [address]  Jenna Tran  same as above   Other household support members/support persons Other support:   MOB identified her mother and the FOB's mother as her primary support system.    II  PSYCHOSOCIAL DATA Information Source:  Patient Interview  Event organiser Employment:   MOB stated that she is currently unemployed. She stated that she had saved "a lot" of money prior to moving to [place].   Financial resources:  Medicaid If Medicaid - County:  [place] Other  [employer]  Chemical engineer / Grade:  N/A Government social research officer / Statistician / Early Interventions:   None reported  Cultural issues impacting care:   None reported    III  STRENGTHS Strengths  Adequate Resources  Home prepared for Child (including basic supplies)  Supportive family/friends   Strength comment:    IV  RISK FACTORS AND CURRENT PROBLEMS Current Problem:  YES   Risk Factor & Current Problem Patient Issue Family Issue Risk Factor / Current Problem Comment  Other - See comment Y N MOB does not have physical custody of her 2 other children (ages 49 and 3).  She continues to be in contact with them via phone on a regular basis, and stated that they are coming to visit in June.    V  SOCIAL WORK ASSESSMENT CSW received consult due to reports that MOB  does not have physical custody of her other children.  MOB presented as easily engaged and receptive to the visit. FOB was also in the room, but he was sleeping in the chair. The MOB was observed to be interacting and bonding with the infant. She displayed an appropriate range in affect and was in a pleasant mood.  MOB was not forthcoming with information, including her thoughts and feelings, but was respectful, and was willing to complete the assessment.   MOB reported feeling excited that the infant has been born.  She stated that she had been eagerly awaiting for his arrival, and stated that she was getting "bored" at home since she was not working during the pregnancy.  MOB stated that she is eager to return to work once she is able to do so.  MOB reported having the home prepared for the infant, and discussed having positive family support. She stated that she and the FOB moved to [name] more than a year ago, and identified the infant's MGM and PGM as the primary supports who also live in [name].  She shared that she is content with her move to [name] since she was previously living with [name].    MOB confirmed that she has two other children, ages 53 and 3.  She stated that they live in [place] with their father.  MOB stated that she continues to have parental rights, and discussed having frequent contact  with them via telephone.  She shared that it [place] when she moved away from her children, but stated that she has adjusted and she will have them back in her physical custody in June.    MOB denied having additional questions, concerns, or needs at this time. She denied needing additional support, and reported feeling comfortable taking infant home at time of discharge. She denied mental health history and denied history of postpartum depression.  MOB agreed to contact her medical provider if she notes onset of symptoms.     VI SOCIAL  WORK PLAN Social Work Secretary/administratorlan  Patient/Family Education  No Further Intervention Required / No Barriers to Discharge   Type of pt/family education:   Postpartum depression   If child protective services report - county: N/A  If child protective services report - date:  N/A Information/referral to community resources comment:   CSW identified no needs.  MOB denied need for additional support and services.   Other social work plan:   CSW to follow up as needed or upon request.

## 2014-08-30 NOTE — Progress Notes (Signed)
UR chart review completed.  

## 2014-08-30 NOTE — Discharge Instructions (Signed)

## 2014-08-30 NOTE — Anesthesia Postprocedure Evaluation (Addendum)
  Anesthesia Post-op Note  Patient: Jenna FrameAmber Tran  Procedure(s) Performed: * No procedures listed *  Patient Location: Mother/Baby  Anesthesia Type:Epidural  Level of Consciousness: awake, alert  and oriented  Airway and Oxygen Therapy: Patient Spontanous Breathing  Post-op Pain: none  Post-op Assessment: Post-op Vital signs reviewed and Patient's Cardiovascular Status Stable  Post-op Vital Signs: Reviewed and stable  Last Vitals:  Filed Vitals:   08/30/14 0549  BP: 107/61  Pulse: 78  Temp: 36.6 C  Resp: 16    Complications: No apparent anesthesia complications    Labs confirmed ; Platelets 203  Epidural catheter pulled.

## 2014-08-30 NOTE — Anesthesia Postprocedure Evaluation (Signed)
  Anesthesia Post-op Note  Patient: Jenna FrameAmber Tran  Procedure(s) Performed: * No procedures listed *  Patient Location: Mother/Baby  Anesthesia Type:Epidural  Level of Consciousness: awake, alert  and oriented  Airway and Oxygen Therapy: Patient Spontanous Breathing  Post-op Pain: none  Post-op Assessment: Post-op Vital signs reviewed and Patient's Cardiovascular Status Stable  Post-op Vital Signs: Reviewed and stable  Last Vitals:  Filed Vitals:   08/30/14 0549  BP: 107/61  Pulse: 78  Temp: 36.6 C  Resp: 16    Complications: No apparent anesthesia complications

## 2014-09-04 ENCOUNTER — Inpatient Hospital Stay (HOSPITAL_COMMUNITY): Admission: RE | Admit: 2014-09-04 | Payer: Medicaid Other | Source: Ambulatory Visit

## 2014-09-14 NOTE — Addendum Note (Signed)
Encounter addended by: Danae Orleanseirdre C Dearia Wilmouth, CNM on: 09/14/2014  8:41 AM<BR>     Documentation filed: Problem List, Message

## 2014-10-06 ENCOUNTER — Ambulatory Visit: Payer: Medicaid Other | Admitting: Obstetrics & Gynecology

## 2014-10-06 ENCOUNTER — Telehealth: Payer: Self-pay

## 2014-10-06 NOTE — Telephone Encounter (Signed)
Patient missed PP visit today. Attempted to contact patient. No answer and no voicemail box set up. Will send letter.

## 2014-10-18 ENCOUNTER — Ambulatory Visit: Payer: Medicaid Other | Admitting: Obstetrics and Gynecology

## 2014-11-11 ENCOUNTER — Encounter: Payer: Self-pay | Admitting: Obstetrics & Gynecology

## 2014-11-11 ENCOUNTER — Ambulatory Visit (INDEPENDENT_AMBULATORY_CARE_PROVIDER_SITE_OTHER): Payer: Medicaid Other | Admitting: Obstetrics & Gynecology

## 2014-11-11 VITALS — BP 112/75 | HR 65 | Temp 97.5°F | Wt 173.1 lb

## 2014-11-11 DIAGNOSIS — Z3009 Encounter for other general counseling and advice on contraception: Secondary | ICD-10-CM

## 2014-11-11 MED ORDER — ETONOGESTREL-ETHINYL ESTRADIOL 0.12-0.015 MG/24HR VA RING: VAGINAL_RING | VAGINAL | Status: DC

## 2014-11-11 NOTE — Progress Notes (Signed)
Patient ID: Jenna FrameAmber Tran, female   DOB: Jul 20, 1985, 29 y.o.   MRN: [id number] Subjective:wants Nuvaring     Jenna Tran is a 29 y.o. female who presents for a postpartum visit. She is 10 weeks postpartum following a spontaneous vaginal delivery. I have fully reviewed the prenatal and intrapartum course. The delivery was at 1965w1d gestational weeks. Outcome: spontaneous vaginal delivery. Anesthesia: epidural. Postpartum course has been good. Baby's course has been normal. Baby is feeding by breast. Bleeding no bleeding. Bowel function is normal. Bladder function is normal. Patient is sexually active. Contraception method is none. Postpartum depression screening: negative.  The following portions of the patient's history were reviewed and updated as appropriate: allergies, current medications, past family history, past medical history, past social history, past surgical history and problem list.  Review of Systems Pertinent items are noted in HPI.   Objective:    There were no vitals taken for this visit.  General:  alert, cooperative and no distress           Abdomen: non distended   Vulva:  not evaluated  Vagina: not evaluated  Cervix:     Corpus: not examined  Adnexa:  not evaluated  Rectal Exam: Not performed.        Assessment:     normal postpartum exam. Pap smear not done at today's visit.   Plan:    1. Contraception: NuvaRing vaginal inserts 2. Repeat pap routine, pos HPV neg cytology 3. Follow up as needed.    Adam PhenixJames G Arnold, MD 11/11/2014

## 2015-05-30 ENCOUNTER — Encounter: Payer: Self-pay | Admitting: Obstetrics & Gynecology

## 2015-05-30 ENCOUNTER — Ambulatory Visit (INDEPENDENT_AMBULATORY_CARE_PROVIDER_SITE_OTHER): Payer: Medicaid Other | Admitting: Obstetrics & Gynecology

## 2015-05-30 VITALS — BP 113/68 | HR 68 | Ht 61.0 in | Wt 180.6 lb

## 2015-05-30 DIAGNOSIS — Z3009 Encounter for other general counseling and advice on contraception: Secondary | ICD-10-CM | POA: Diagnosis present

## 2015-05-30 DIAGNOSIS — Z124 Encounter for screening for malignant neoplasm of cervix: Secondary | ICD-10-CM

## 2015-05-30 DIAGNOSIS — Z3049 Encounter for surveillance of other contraceptives: Secondary | ICD-10-CM | POA: Diagnosis not present

## 2015-05-30 DIAGNOSIS — Z01419 Encounter for gynecological examination (general) (routine) without abnormal findings: Secondary | ICD-10-CM | POA: Diagnosis not present

## 2015-05-30 DIAGNOSIS — Z113 Encounter for screening for infections with a predominantly sexual mode of transmission: Secondary | ICD-10-CM | POA: Diagnosis not present

## 2015-05-30 DIAGNOSIS — Z0000 Encounter for general adult medical examination without abnormal findings: Secondary | ICD-10-CM | POA: Diagnosis not present

## 2015-05-30 MED ORDER — METRONIDAZOLE 500 MG PO TABS: 500.0000 mg | ORAL_TABLET | Freq: Two times a day (BID) | ORAL | Status: DC
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
## 2015-06-01 LAB — CYTOLOGY - PAP

## 2015-06-17 ENCOUNTER — Ambulatory Visit: Payer: Medicaid Other | Admitting: Family Medicine

## 2015-07-04 ENCOUNTER — Encounter: Payer: Self-pay | Admitting: Family Medicine

## 2015-07-04 ENCOUNTER — Ambulatory Visit (INDEPENDENT_AMBULATORY_CARE_PROVIDER_SITE_OTHER): Payer: Medicaid Other | Admitting: Family Medicine

## 2015-07-04 VITALS — BP 109/75 | HR 87 | Temp 98.2°F | Ht 62.0 in | Wt 182.0 lb

## 2015-07-04 DIAGNOSIS — Z3202 Encounter for pregnancy test, result negative: Secondary | ICD-10-CM

## 2015-07-04 DIAGNOSIS — Z30017 Encounter for initial prescription of implantable subdermal contraceptive: Secondary | ICD-10-CM

## 2015-07-04 DIAGNOSIS — Z30018 Encounter for initial prescription of other contraceptives: Secondary | ICD-10-CM

## 2015-07-04 DIAGNOSIS — Z3049 Encounter for surveillance of other contraceptives: Secondary | ICD-10-CM

## 2015-07-04 DIAGNOSIS — B360 Pityriasis versicolor: Secondary | ICD-10-CM

## 2015-07-04 DIAGNOSIS — Z975 Presence of (intrauterine) contraceptive device: Secondary | ICD-10-CM

## 2015-07-04 LAB — POCT PREGNANCY, URINE: Preg Test, Ur: NEGATIVE

## 2015-07-04 MED ORDER — ETONOGESTREL 68 MG ~~LOC~~ IMPL
68.0000 mg | DRUG_IMPLANT | Freq: Once | SUBCUTANEOUS | Status: AC
  Administered 2015-07-04: 68 mg via SUBCUTANEOUS

## 2015-07-04 MED ORDER — KETOCONAZOLE 2 % EX CREA: 1.0000 "application " | TOPICAL_CREAM | Freq: Every day | CUTANEOUS | Status: AC

## 2015-07-04 NOTE — Progress Notes (Signed)
Patient ID: Alease Frame, female   DOB: 1985-08-30, 30 y.o.   MRN: [id number]    GYNECOLOGY CLINIC PROCEDURE NOTE  Rokhaya Quinn is a 30 y.o. [phone] here for Nexplanon insertion.  Last pap smear was on 05/30/2015 and was normal.  No other gynecologic concerns.  Urine pregnancy test- NEG  Nexplanon Insertion Procedure Patient identified, informed consent performed, consent signed.   Patient does understand that irregular bleeding is a very common side effect of this medication. She was advised to have backup contraception for one week after placement. Pregnancy test in clinic today was negative.  Appropriate time out taken.  Patient's left arm was prepped and draped in the usual sterile fashion.. The ruler used to measure and mark insertion area.  Patient was prepped with alcohol swab and then injected with 3 ml of 1% lidocaine.  She was prepped with betadine, Nexplanon removed from packaging,  Device confirmed in needle, then inserted full length of needle and withdrawn per handbook instructions. Nexplanon was able to palpated in the patient's arm; patient palpated the insert herself. There was minimal blood loss.  Patient insertion site covered with guaze and a pressure bandage to reduce any bruising.  The patient tolerated the procedure well and was given post procedure instructions.     BP 109/75 mmHg  Pulse 87  Temp(Src) 98.2 F (36.8 C) (Oral)  Ht  (1.575 m)  Wt 182 lb (82.555 kg)  BMI 33.28 kg/m2  LMP 06/14/2015  Breastfeeding? No  Federico Flake, MD  Gen: well appearing CV: RR Lung: normal WOB Skin: hypopigmented patches on neck and arms  A/P: Fredda Clarida is a 30 y.o. [phone] here for nexplanon placement and with a rash  #Nexplanon- placed successfully. Given warning s/sx of infection.   #Tinea versicolor- ketoconazole cream rx sent to pharmacy  Federico Flake, MD  Family Medicine, OB Fellow  Center for [facility], [facility]

## 2015-07-04 NOTE — Patient Instructions (Signed)
Etonogestrel implant What is this medicine? ETONOGESTREL (et oh noe JES trel) is a contraceptive (birth control) device. It is used to prevent pregnancy. It can be used for up to 3 years. This medicine may be used for other purposes; ask your health care provider or pharmacist if you have questions. What should I tell my health care provider before I take this medicine? They need to know if you have any of these conditions: -abnormal vaginal bleeding -blood vessel disease or blood clots -cancer of the breast, cervix, or liver -depression -diabetes -gallbladder disease -headaches -heart disease or recent heart attack -high blood pressure -high cholesterol -kidney disease -liver disease -renal disease -seizures -tobacco smoker -an unusual or allergic reaction to etonogestrel, other hormones, anesthetics or antiseptics, medicines, foods, dyes, or preservatives -pregnant or trying to get pregnant -breast-feeding How should I use this medicine? This device is inserted just under the skin on the inner side of your upper arm by a health care professional. Talk to your pediatrician regarding the use of this medicine in children. Special care may be needed. Overdosage: If you think you have taken too much of this medicine contact a poison control center or emergency room at once. NOTE: This medicine is only for you. Do not share this medicine with others. What if I miss a dose? This does not apply. What may interact with this medicine? Do not take this medicine with any of the following medications: -amprenavir -bosentan -fosamprenavir This medicine may also interact with the following medications: -barbiturate medicines for inducing sleep or treating seizures -certain medicines for fungal infections like ketoconazole and itraconazole -griseofulvin -medicines to treat seizures like carbamazepine, felbamate, oxcarbazepine, phenytoin,  topiramate -modafinil -phenylbutazone -rifampin -some medicines to treat HIV infection like atazanavir, indinavir, lopinavir, nelfinavir, tipranavir, ritonavir -St. John's wort This list may not describe all possible interactions. Give your health care provider a list of all the medicines, herbs, non-prescription drugs, or dietary supplements you use. Also tell them if you smoke, drink alcohol, or use illegal drugs. Some items may interact with your medicine. What should I watch for while using this medicine? This product does not protect you against HIV infection (AIDS) or other sexually transmitted diseases. You should be able to feel the implant by pressing your fingertips over the skin where it was inserted. Contact your doctor if you cannot feel the implant, and use a non-hormonal birth control method (such as condoms) until your doctor confirms that the implant is in place. If you feel that the implant may have broken or become bent while in your arm, contact your healthcare provider. What side effects may I notice from receiving this medicine? Side effects that you should report to your doctor or health care professional as soon as possible: -allergic reactions like skin rash, itching or hives, swelling of the face, lips, or tongue -breast lumps -changes in emotions or moods -depressed mood -heavy or prolonged menstrual bleeding -pain, irritation, swelling, or bruising at the insertion site -scar at site of insertion -signs of infection at the insertion site such as fever, and skin redness, pain or discharge -signs of pregnancy -signs and symptoms of a blood clot such as breathing problems; changes in vision; chest pain; severe, sudden headache; pain, swelling, warmth in the leg; trouble speaking; sudden numbness or weakness of the face, arm or leg -signs and symptoms of liver injury like dark yellow or brown urine; general ill feeling or flu-like symptoms; light-colored stools; loss of  appetite; nausea; right upper belly   pain; unusually weak or tired; yellowing of the eyes or skin -unusual vaginal bleeding, discharge -signs and symptoms of a stroke like changes in vision; confusion; trouble speaking or understanding; severe headaches; sudden numbness or weakness of the face, arm or leg; trouble walking; dizziness; loss of balance or coordination Side effects that usually do not require medical attention (Report these to your doctor or health care professional if they continue or are bothersome.): -acne -back pain -breast pain -changes in weight -dizziness -general ill feeling or flu-like symptoms -headache -irregular menstrual bleeding -nausea -sore throat -vaginal irritation or inflammation This list may not describe all possible side effects. Call your doctor for medical advice about side effects. You may report side effects to FDA at 1-800-FDA-1088. Where should I keep my medicine? This drug is given in a hospital or clinic and will not be stored at home. NOTE: This sheet is a summary. It may not cover all possible information. If you have questions about this medicine, talk to your doctor, pharmacist, or health care provider.    2016, Elsevier/Gold Standard. (2014-02-12 14:07:06)  

## 2015-10-23 ENCOUNTER — Emergency Department (HOSPITAL_BASED_OUTPATIENT_CLINIC_OR_DEPARTMENT_OTHER): Payer: No Typology Code available for payment source

## 2015-10-23 ENCOUNTER — Encounter (HOSPITAL_BASED_OUTPATIENT_CLINIC_OR_DEPARTMENT_OTHER): Payer: Self-pay | Admitting: Emergency Medicine

## 2015-10-23 ENCOUNTER — Emergency Department (HOSPITAL_BASED_OUTPATIENT_CLINIC_OR_DEPARTMENT_OTHER)
Admission: EM | Admit: 2015-10-23 | Discharge: 2015-10-23 | Disposition: A | Discharge status: Home / Self Care | Payer: No Typology Code available for payment source | Attending: Emergency Medicine | Admitting: Emergency Medicine

## 2015-10-23 DIAGNOSIS — S0081XA Abrasion of other part of head, initial encounter: Secondary | ICD-10-CM | POA: Insufficient documentation

## 2015-10-23 DIAGNOSIS — Z87891 Personal history of nicotine dependence: Secondary | ICD-10-CM | POA: Insufficient documentation

## 2015-10-23 DIAGNOSIS — Y92410 Unspecified street and highway as the place of occurrence of the external cause: Secondary | ICD-10-CM | POA: Insufficient documentation

## 2015-10-23 DIAGNOSIS — S50812A Abrasion of left forearm, initial encounter: Secondary | ICD-10-CM | POA: Diagnosis not present

## 2015-10-23 DIAGNOSIS — Y999 Unspecified external cause status: Secondary | ICD-10-CM | POA: Insufficient documentation

## 2015-10-23 DIAGNOSIS — Y9389 Activity, other specified: Secondary | ICD-10-CM | POA: Diagnosis not present

## 2015-10-23 DIAGNOSIS — S80812A Abrasion, left lower leg, initial encounter: Secondary | ICD-10-CM | POA: Insufficient documentation

## 2015-10-23 DIAGNOSIS — T148XXA Other injury of unspecified body region, initial encounter: Secondary | ICD-10-CM

## 2015-10-23 DIAGNOSIS — S59912A Unspecified injury of left forearm, initial encounter: Secondary | ICD-10-CM | POA: Diagnosis present

## 2015-10-23 LAB — PREGNANCY, URINE: Preg Test, Ur: NEGATIVE

## 2015-10-23 MED ORDER — IBUPROFEN 800 MG PO TABS
800.0000 mg | ORAL_TABLET | Freq: Once | ORAL | Status: AC
  Administered 2015-10-23: 800 mg via ORAL
  Filled 2015-10-23: qty 1

## 2015-10-23 MED ORDER — IBUPROFEN 600 MG PO TABS: 600.0000 mg | ORAL_TABLET | Freq: Four times a day (QID) | ORAL | Status: DC | PRN

## 2015-10-23 MED ORDER — TETANUS-DIPHTH-ACELL PERTUSSIS 5-2.5-18.5 LF-MCG/0.5 IM SUSP
0.5000 mL | Freq: Once | INTRAMUSCULAR | Status: DC
  Filled 2015-10-23: qty 0.5

## 2015-10-23 MED ORDER — METRONIDAZOLE 500 MG PO TABS: 500.0000 mg | ORAL_TABLET | Freq: Two times a day (BID) | ORAL | Status: DC

## 2015-10-23 MED ORDER — CIPROFLOXACIN HCL 500 MG PO TABS: 500.0000 mg | ORAL_TABLET | Freq: Two times a day (BID) | ORAL | Status: DC
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
## 2015-10-27 ENCOUNTER — Emergency Department (HOSPITAL_BASED_OUTPATIENT_CLINIC_OR_DEPARTMENT_OTHER): Payer: No Typology Code available for payment source

## 2015-10-27 ENCOUNTER — Encounter (HOSPITAL_BASED_OUTPATIENT_CLINIC_OR_DEPARTMENT_OTHER): Payer: Self-pay | Admitting: *Deleted

## 2015-10-27 ENCOUNTER — Emergency Department (HOSPITAL_BASED_OUTPATIENT_CLINIC_OR_DEPARTMENT_OTHER)
Admission: EM | Admit: 2015-10-27 | Discharge: 2015-10-27 | Disposition: A | Discharge status: Home / Self Care | Payer: No Typology Code available for payment source | Attending: Emergency Medicine | Admitting: Emergency Medicine

## 2015-10-27 DIAGNOSIS — F0781 Postconcussional syndrome: Secondary | ICD-10-CM | POA: Diagnosis not present

## 2015-10-27 DIAGNOSIS — Z87891 Personal history of nicotine dependence: Secondary | ICD-10-CM | POA: Insufficient documentation

## 2015-10-27 DIAGNOSIS — R110 Nausea: Secondary | ICD-10-CM | POA: Diagnosis not present

## 2015-10-27 DIAGNOSIS — R42 Dizziness and giddiness: Secondary | ICD-10-CM | POA: Insufficient documentation

## 2015-10-27 DIAGNOSIS — R51 Headache: Secondary | ICD-10-CM | POA: Diagnosis present

## 2015-10-27 DIAGNOSIS — R519 Headache, unspecified: Secondary | ICD-10-CM

## 2015-10-27 MED ORDER — BUTALBITAL-APAP-CAFFEINE 50-325-40 MG PO TABS: 1.0000 | ORAL_TABLET | Freq: Four times a day (QID) | ORAL | Status: DC | PRN

## 2015-10-27 MED ORDER — PROCHLORPERAZINE EDISYLATE 5 MG/ML IJ SOLN
5.0000 mg | Freq: Once | INTRAMUSCULAR | Status: AC
  Administered 2015-10-27: 5 mg via INTRAVENOUS
  Filled 2015-10-27: qty 2

## 2015-10-27 MED ORDER — ONDANSETRON HCL 4 MG/2ML IJ SOLN
4.0000 mg | Freq: Once | INTRAMUSCULAR | Status: DC
  Filled 2015-10-27: qty 2

## 2015-10-27 MED ORDER — DIPHENHYDRAMINE HCL 50 MG/ML IJ SOLN
25.0000 mg | Freq: Once | INTRAMUSCULAR | Status: AC
  Administered 2015-10-27: 25 mg via INTRAVENOUS
  Filled 2015-10-27: qty 1

## 2015-10-27 MED ORDER — ONDANSETRON HCL 4 MG PO TABS: 4.0000 mg | ORAL_TABLET | Freq: Four times a day (QID) | ORAL | Status: DC
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
## 2015-10-27 NOTE — Discharge Instructions (Signed)
Medications: Fioricet, Zofran  Treatment: Take 1-2 Fioricet every 6 hours as needed for headache. Do not take Tylenol with Fioricet, as it has Tylenol in it. Take Zofran every 6 hours as needed for nausea. Decreased screen time as much as possible, probably less than 15-20 minutes daily. Resting your brain is the best way to heal it and resolved your symptoms.  Follow-up: Please follow-up and establish care with a primary care provider by calling the number outlined on your discharge paperwork. Please follow-up with a physical therapist or chiropractor for therapy for your most likely postconcussion syndrome. Please return to emergency department if you're developing any new or worsening symptoms, including more episodes of incontinence, numbness in your groin, or any other concerning symptoms.   Post-Concussion Syndrome Post-concussion syndrome describes the symptoms that can occur after a head injury. These symptoms can last from weeks to months. CAUSES  It is not clear why some head injuries cause post-concussion syndrome. It can occur whether your head injury was mild or severe and whether you were wearing head protection or not.  SIGNS AND SYMPTOMS  Memory difficulties.  Dizziness.  Headaches.  Double vision or blurry vision.  Sensitivity to light.  Hearing difficulties.  Depression.  Tiredness.  Weakness.  Difficulty with concentration.  Difficulty sleeping or staying asleep.  Vomiting.  Poor balance or instability on your feet.  Slow reaction time.  Difficulty learning and remembering things you have heard. DIAGNOSIS  There is no test to determine whether you have post-concussion syndrome. Your health care provider may order an imaging scan of your brain, such as a CT scan, to check for other problems that may be causing your symptoms (such as a severe injury inside your skull). TREATMENT  Usually, these problems disappear over time without medical care. Your  health care provider may prescribe medicine to help ease your symptoms. It is important to follow up with a neurologist to evaluate your recovery and address any lingering symptoms or issues. HOME CARE INSTRUCTIONS   Take medicines only as directed by your health care provider. Do not take aspirin. Aspirin can slow blood clotting.  Sleep with your head slightly elevated to help with headaches.  Avoid any situation where there is potential for another head injury. This includes football, hockey, soccer, basketball, martial arts, downhill snow sports, and horseback riding. Your condition will get worse every time you experience a concussion. You should avoid these activities until you are evaluated by the appropriate follow-up health care providers.  Keep all follow-up visits as directed by your health care provider. This is important. SEEK MEDICAL CARE IF:  You have increased problems paying attention or concentrating.  You have increased difficulty remembering or learning new information.  You need more time to complete tasks or assignments than before.  You have increased irritability or decreased ability to cope with stress.  You have more symptoms than before. Seek medical care if you have any of the following symptoms for more than two weeks after your injury:  Lasting (chronic) headaches.  Dizziness or balance problems.  Nausea.  Vision problems.  Increased sensitivity to noise or light.  Depression or mood swings.  Anxiety or irritability.  Memory problems.  Difficulty concentrating or paying attention.  Sleep problems.  Feeling tired all the time. SEEK IMMEDIATE MEDICAL CARE IF:  You have confusion or unusual drowsiness.  Others find it difficult to wake you up.  You have nausea or persistent, forceful vomiting.  You feel like you are moving  when you are not (vertigo). Your eyes may move rapidly back and forth.  You have convulsions or faint.  You have  severe, persistent headaches that are not relieved by medicine.  You cannot use your arms or legs normally.  One of your pupils is larger than the other.  You have clear or bloody discharge from your nose or ears.  Your problems are getting worse, not better. MAKE SURE YOU:  Understand these instructions.  Will watch your condition.  Will get help right away if you are not doing well or get worse.   This information is not intended to replace advice given to you by your health care provider. Make sure you discuss any questions you have with your health care provider.   Document Released: 10/20/2001 Document Revised: 05/21/2014 Document Reviewed: 08/05/2013 Elsevier Interactive Patient Education [employer]
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
## 2015-10-27 NOTE — ED Notes (Signed)
PA-C at bedside 

## 2015-12-28 ENCOUNTER — Encounter: Payer: Self-pay | Admitting: *Deleted

## 2016-01-02 ENCOUNTER — Emergency Department (HOSPITAL_BASED_OUTPATIENT_CLINIC_OR_DEPARTMENT_OTHER): Payer: Medicaid Other

## 2016-01-02 ENCOUNTER — Emergency Department (HOSPITAL_BASED_OUTPATIENT_CLINIC_OR_DEPARTMENT_OTHER)
Admission: EM | Admit: 2016-01-02 | Discharge: 2016-01-02 | Disposition: A | Discharge status: Home / Self Care | Payer: Medicaid Other | Attending: Emergency Medicine | Admitting: Emergency Medicine

## 2016-01-02 ENCOUNTER — Encounter (HOSPITAL_BASED_OUTPATIENT_CLINIC_OR_DEPARTMENT_OTHER): Payer: Self-pay

## 2016-01-02 DIAGNOSIS — S93602A Unspecified sprain of left foot, initial encounter: Secondary | ICD-10-CM | POA: Insufficient documentation

## 2016-01-02 DIAGNOSIS — Z79899 Other long term (current) drug therapy: Secondary | ICD-10-CM | POA: Diagnosis not present

## 2016-01-02 DIAGNOSIS — Y9301 Activity, walking, marching and hiking: Secondary | ICD-10-CM | POA: Diagnosis not present

## 2016-01-02 DIAGNOSIS — Z87891 Personal history of nicotine dependence: Secondary | ICD-10-CM | POA: Insufficient documentation

## 2016-01-02 DIAGNOSIS — Y929 Unspecified place or not applicable: Secondary | ICD-10-CM | POA: Diagnosis not present

## 2016-01-02 DIAGNOSIS — W010XXA Fall on same level from slipping, tripping and stumbling without subsequent striking against object, initial encounter: Secondary | ICD-10-CM | POA: Diagnosis not present

## 2016-01-02 DIAGNOSIS — Y999 Unspecified external cause status: Secondary | ICD-10-CM | POA: Diagnosis not present

## 2016-01-02 DIAGNOSIS — M722 Plantar fascial fibromatosis: Secondary | ICD-10-CM

## 2016-01-02 DIAGNOSIS — S99922A Unspecified injury of left foot, initial encounter: Secondary | ICD-10-CM | POA: Diagnosis present

## 2016-01-02 HISTORY — DX: Plantar fascial fibromatosis: M72.2

## 2016-01-02 MED ORDER — TRAMADOL HCL 50 MG PO TABS: 50.0000 mg | ORAL_TABLET | Freq: Four times a day (QID) | ORAL | 0 refills | Status: DC | PRN

## 2016-01-02 MED FILL — traMADol HCL 50 MG TABS: 50 | 3 days supply | Qty: 15 | Fill #0

## 2016-01-02 NOTE — Discharge Instructions (Signed)
Ice and elevate her foot. Continue to wear good arch supports. Follow-up with your podiatrist.
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
## 2016-02-01 ENCOUNTER — Ambulatory Visit (INDEPENDENT_AMBULATORY_CARE_PROVIDER_SITE_OTHER): Payer: Medicaid Other | Admitting: Obstetrics & Gynecology

## 2016-02-02 ENCOUNTER — Ambulatory Visit: Payer: Medicaid Other | Admitting: Certified Nurse Midwife

## 2016-02-21 ENCOUNTER — Emergency Department (HOSPITAL_BASED_OUTPATIENT_CLINIC_OR_DEPARTMENT_OTHER): Payer: Medicaid Other

## 2016-02-21 ENCOUNTER — Emergency Department (HOSPITAL_BASED_OUTPATIENT_CLINIC_OR_DEPARTMENT_OTHER)
Admission: EM | Admit: 2016-02-21 | Discharge: 2016-02-21 | Disposition: A | Discharge status: Home / Self Care | Payer: Medicaid Other | Attending: Emergency Medicine | Admitting: Emergency Medicine

## 2016-02-21 ENCOUNTER — Encounter (HOSPITAL_BASED_OUTPATIENT_CLINIC_OR_DEPARTMENT_OTHER): Payer: Self-pay | Admitting: *Deleted

## 2016-02-21 DIAGNOSIS — Z87891 Personal history of nicotine dependence: Secondary | ICD-10-CM | POA: Insufficient documentation

## 2016-02-21 DIAGNOSIS — R05 Cough: Secondary | ICD-10-CM

## 2016-02-21 DIAGNOSIS — R059 Cough, unspecified: Secondary | ICD-10-CM

## 2016-02-21 DIAGNOSIS — Z79891 Long term (current) use of opiate analgesic: Secondary | ICD-10-CM | POA: Diagnosis not present

## 2016-02-21 DIAGNOSIS — J069 Acute upper respiratory infection, unspecified: Secondary | ICD-10-CM | POA: Diagnosis not present

## 2016-02-21 MED ORDER — BENZONATATE 100 MG PO CAPS: 100.0000 mg | ORAL_CAPSULE | Freq: Three times a day (TID) | ORAL | 0 refills | Status: DC

## 2016-02-21 MED FILL — BENZONATATE 100 MG CAPSULE: 100 | 7 days supply | Qty: 21 | Fill #0

## 2016-02-21 NOTE — ED Triage Notes (Signed)
Cough x  Weeks.

## 2016-02-21 NOTE — Discharge Instructions (Signed)
1. Medications: flonase and/or mucinex can be used for nasal congestion, tessalon as needed for cough, continue usual home medications 2. Treatment: rest, drink plenty of fluids 3. Follow Up: Please follow up with your primary doctor for discussion of your diagnoses and further evaluation after today's visit if symptoms persist longer than 10-14 days; Return to the ER for high fevers, difficulty breathing or other concerning symptoms
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
## 2016-02-29 ENCOUNTER — Ambulatory Visit: Payer: Medicaid Other | Admitting: Obstetrics & Gynecology

## 2016-05-08 ENCOUNTER — Encounter: Payer: Medicaid Other | Admitting: Obstetrics & Gynecology

## 2016-05-08 NOTE — Progress Notes (Signed)
This encounter was created in error - please disregard.

## 2016-05-26 IMAGING — US US OB FOLLOW-UP
2 series · 12 of 28 positions shown · non-contrast
Comparison: none

[Series 1: us ob follow up · 1 of 5 slices shown (1 of 2)]
[im 3/5]
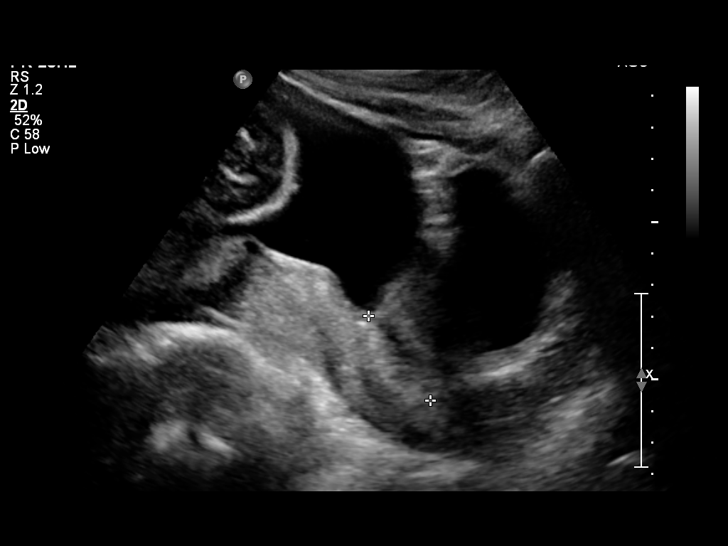

[Series 1: us ob follow up · 11 of 48 slices shown (2 of 2)]
[im 1/48]
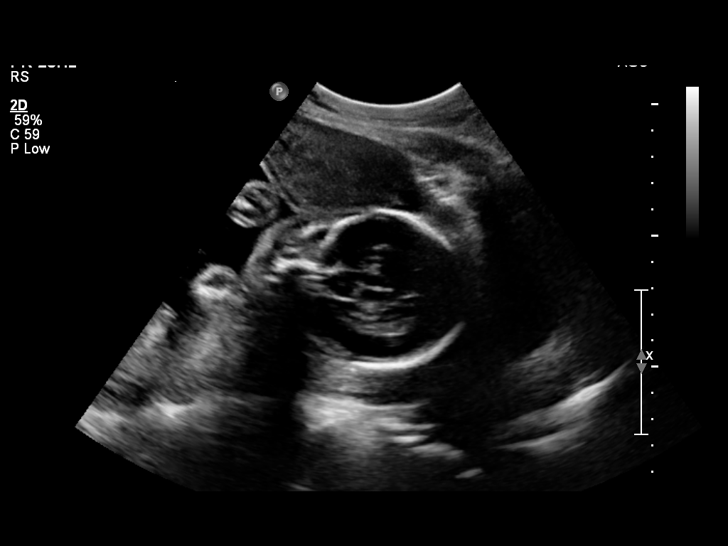
[im 4/48]
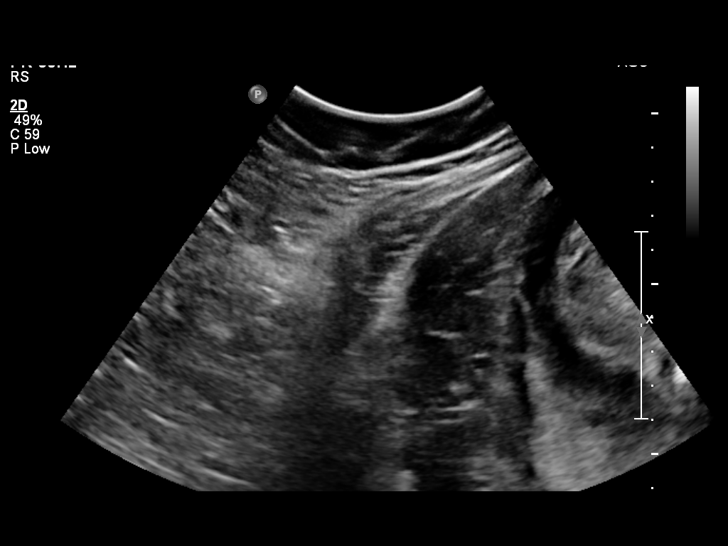
[im 10/48]
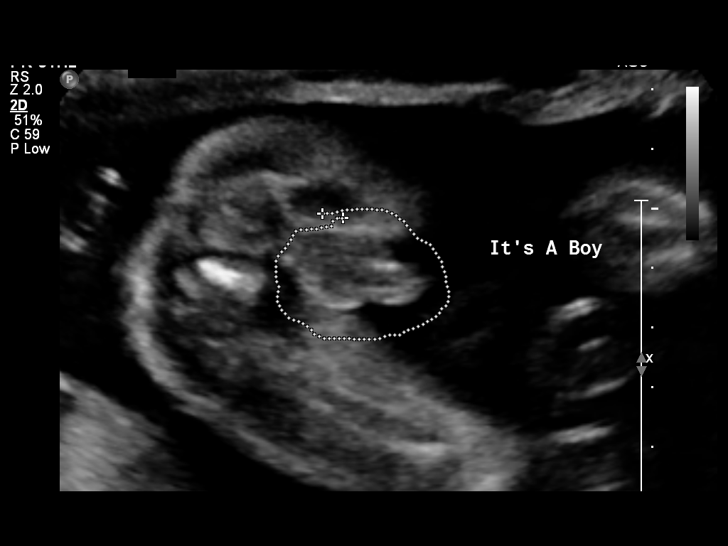
[im 14/48]
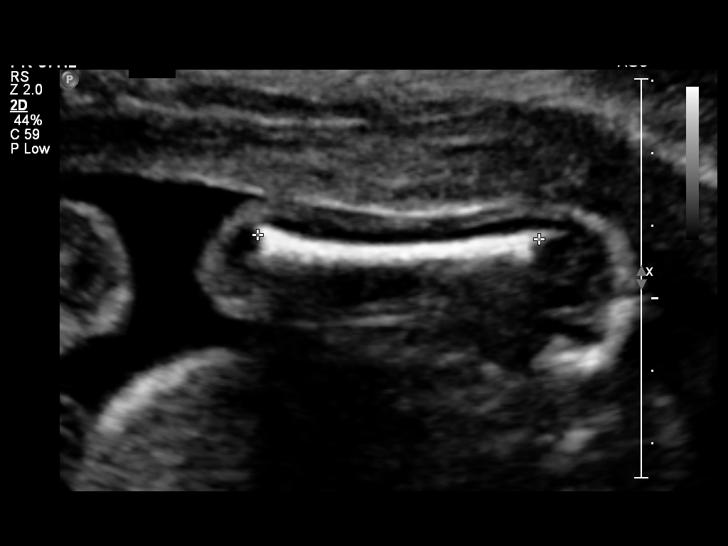
[im 18/48]
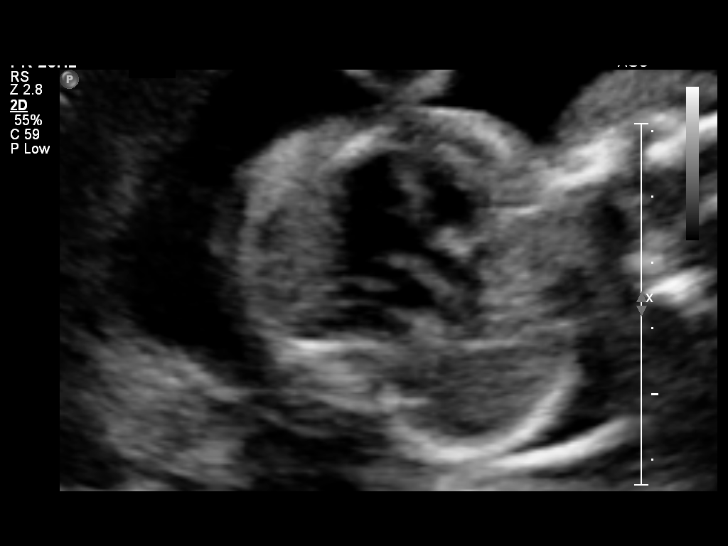
[im 24/48]
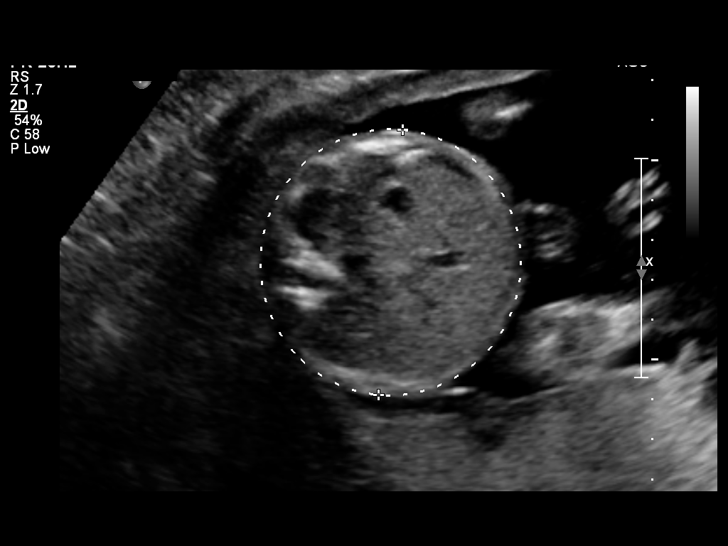
[im 28/48]
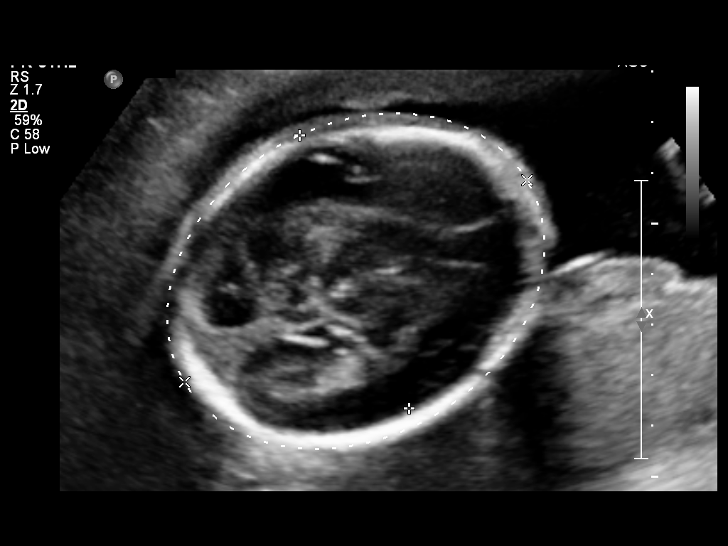
[im 32/48]
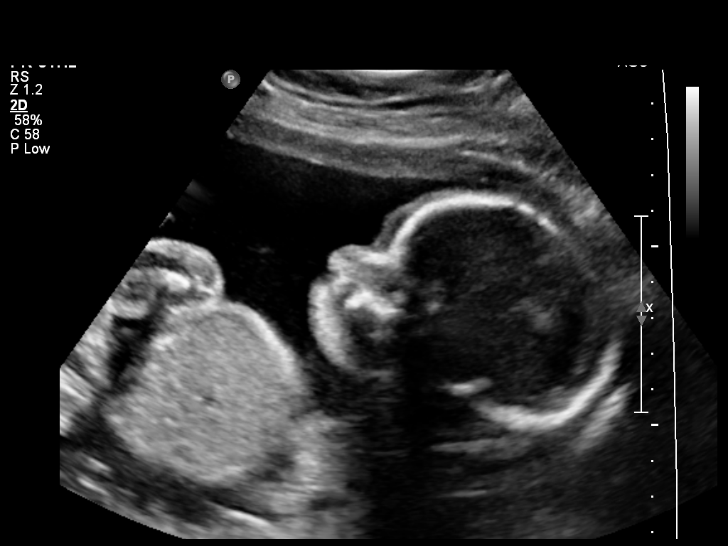
[im 38/48]
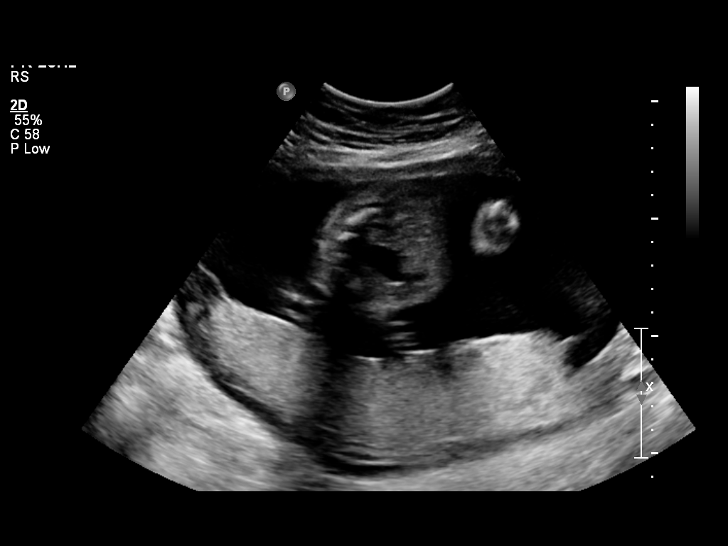
[im 42/48]
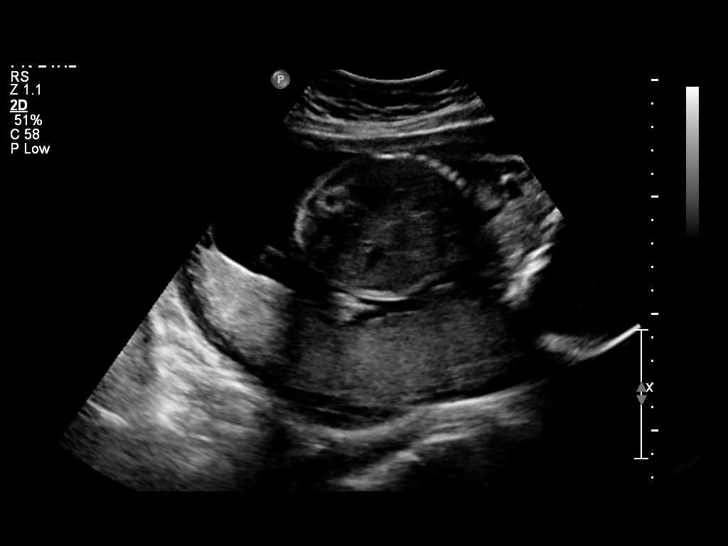
[im 46/48]
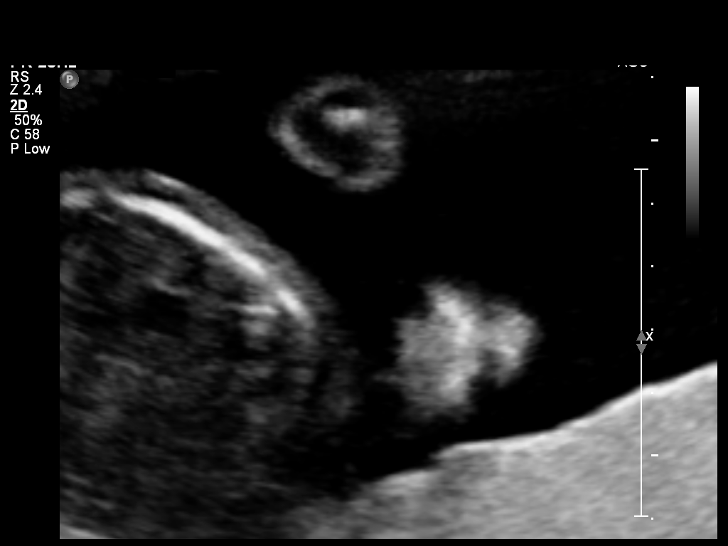

[12 of 28 positions shown; findings below may reference images not displayed]

OBSTETRICS REPORT
                      (Signed Final 05/04/2014 [DATE])

Service(s) Provided

 US OB FOLLOW UP                                       76816.1
Indications

 23 weeks gestation of pregnancy
 Obesity complicating pregnancy
 Follow-up incomplete fetal anatomic evaluation        Z36
Fetal Evaluation

 Num Of Fetuses:    1
 Fetal Heart Rate:  132                          bpm
 Cardiac Activity:  Observed
 Presentation:      Cephalic
 Placenta:          Posterior, above cervical
                    os
 P. Cord            Previously Visualized
 Insertion:

 Amniotic Fluid
 AFI FV:      Subjectively within normal limits
                                             Larg Pckt:    5.79  cm
Biometry

 BPD:     57.6  mm     G. Age:  23w 4d                CI:        68.22   70 - 86
                                                      FL/HC:      18.3   19.2 -

 HC:       223  mm     G. Age:  24w 2d       69  %    HC/AC:      1.08   1.05 -

 AC:     206.8  mm     G. Age:  25w 2d       90  %    FL/BPD:     70.8   71 - 87
 FL:      40.8  mm     G. Age:  23w 2d       32  %    FL/AC:      19.7   20 - 24
 HUM:     38.1  mm     G. Age:  23w 3d       43  %

 Est. FW:     686  gm      1 lb 8 oz     68  %
Gestational Age

 LMP:           23w 3d        Date:  11/21/13                 EDD:   08/28/14
 U/S Today:     24w 1d                                        EDD:   08/23/14
 Best:          23w 3d     Det. By:  LMP  (11/21/13)          EDD:   08/28/14
Anatomy
 Cranium:          Previously seen        Aortic Arch:      Previously seen
 Fetal Cavum:      Previously seen        Ductal Arch:      Previously seen
 Ventricles:       Appears normal         Diaphragm:        Appears normal
 Choroid Plexus:   Previously seen        Stomach:          Appears normal, left
                                                            sided
 Cerebellum:       Previously seen        Abdomen:          Appears normal
 Posterior Fossa:  Previously seen        Abdominal Wall:   Previously seen
 Nuchal Fold:      Previously seen        Cord Vessels:     Previously seen
 Face:             Orbits and profile     Kidneys:          Appear normal
                   previously seen
 Lips:             Previously seen        Bladder:          Appears normal
 Heart:            Appears normal         Spine:            Previously seen
                   (4CH, axis, and
                   situs)
 RVOT:             Not well visualized    Lower             Previously seen
                                          Extremities:
 LVOT:             Appears normal         Upper             Previously seen
                                          Extremities:

 Other:  Heels previously visualized. Technically difficult due to maternal
         habitus and fetal position. Fetus appears to be a male.
Targeted Anatomy

 Fetal Central Nervous System
 Lat. Ventricles:
Cervix Uterus Adnexa

 Cervical Length:    4.33     cm

 Cervix:       Normal appearance by transabdominal scan.
 Uterus:       No abnormality visualized.
 Left Ovary:    Size(cm) L: 3.84 x W: 1.98 x H: 1.88  Volume(cc):
 Right Ovary:   Not visualized.

 Adnexa:     No abnormality visualized. No adnexal mass visualized.
Comments

 The patient's fetal anatomic survey is now complete.  Several
 images suggest tricuspid valve thickening as well as
 restricted movement of the valve leaflets raising concern for
 tricuspid stenosis or atresia.  No other fetal anomalies or soft
 markers of aneuploidy were seen.  Further assessment of the
 fetal cardiac anatomy is warranted.  Ms. Idjis should be
 scheduled for a follow-up ultrasound in the [facility] as soon as
 possible.
Impression

 Single living intrauterine pregnancy at 23 weeks 3 days.
 Appropriate fetal growth (68%).
 Normal amniotic fluid volume.
 Possible tricuspid stenosis or atresia.
 Otherwise normal fetal anatomy.
 No other fetal anomalies or soft markers of aneuploidy seen.
Recommendations

 Recommend follow-up ultrasound examination in the [facility]
 as soon as possible to re-evaulate fetal cardiac anatomy.

 questions or concerns.
                Ibirahima, Abdalmohimn

## 2016-06-29 ENCOUNTER — Encounter (HOSPITAL_BASED_OUTPATIENT_CLINIC_OR_DEPARTMENT_OTHER): Payer: Self-pay | Admitting: *Deleted

## 2016-06-29 ENCOUNTER — Emergency Department (HOSPITAL_BASED_OUTPATIENT_CLINIC_OR_DEPARTMENT_OTHER)
Admission: EM | Admit: 2016-06-29 | Discharge: 2016-06-29 | Disposition: A | Discharge status: Home / Self Care | Payer: Medicaid Other | Attending: Emergency Medicine | Admitting: Emergency Medicine

## 2016-06-29 DIAGNOSIS — R509 Fever, unspecified: Secondary | ICD-10-CM

## 2016-06-29 DIAGNOSIS — J069 Acute upper respiratory infection, unspecified: Secondary | ICD-10-CM | POA: Diagnosis not present

## 2016-06-29 DIAGNOSIS — Z87891 Personal history of nicotine dependence: Secondary | ICD-10-CM | POA: Insufficient documentation

## 2016-06-29 MED ORDER — HYDROCORTISONE 1 % EX CREA: TOPICAL_CREAM | CUTANEOUS | 0 refills | Status: DC
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
## 2016-06-29 NOTE — Discharge Instructions (Signed)
Tylenol 1000 mg rotated with ibuprofen 600 mg every 4 hours as needed for fever.  Drink plenty of fluids and get plenty of rest.  Return to the emergency department if you develop severe chest pain, difficulty breathing, or other new and concerning symptoms area

## 2016-06-29 NOTE — ED Triage Notes (Signed)
Pt c/o URi symptoms 3 days , son also sick with same and being seen

## 2016-07-19 ENCOUNTER — Encounter (HOSPITAL_BASED_OUTPATIENT_CLINIC_OR_DEPARTMENT_OTHER): Payer: Self-pay | Admitting: Emergency Medicine

## 2016-07-19 ENCOUNTER — Emergency Department (HOSPITAL_BASED_OUTPATIENT_CLINIC_OR_DEPARTMENT_OTHER)
Admission: EM | Admit: 2016-07-19 | Discharge: 2016-07-19 | Disposition: A | Discharge status: Home / Self Care | Payer: Medicaid Other | Attending: Emergency Medicine | Admitting: Emergency Medicine

## 2016-07-19 DIAGNOSIS — R0789 Other chest pain: Secondary | ICD-10-CM | POA: Diagnosis not present

## 2016-07-19 DIAGNOSIS — R072 Precordial pain: Secondary | ICD-10-CM | POA: Diagnosis present

## 2016-07-19 DIAGNOSIS — Z87891 Personal history of nicotine dependence: Secondary | ICD-10-CM | POA: Diagnosis not present

## 2016-07-19 NOTE — ED Triage Notes (Signed)
Patient states that she has had chest pain for over a week. Reports that she started to take new supplements for exercising
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
## 2016-09-04 IMAGING — US US OB FOLLOW-UP
1 series · 12 of 28 positions shown · non-contrast
Comparison: none

[Series 1: us ob follow up · 12 of 34 slices shown]
[im 2/34]
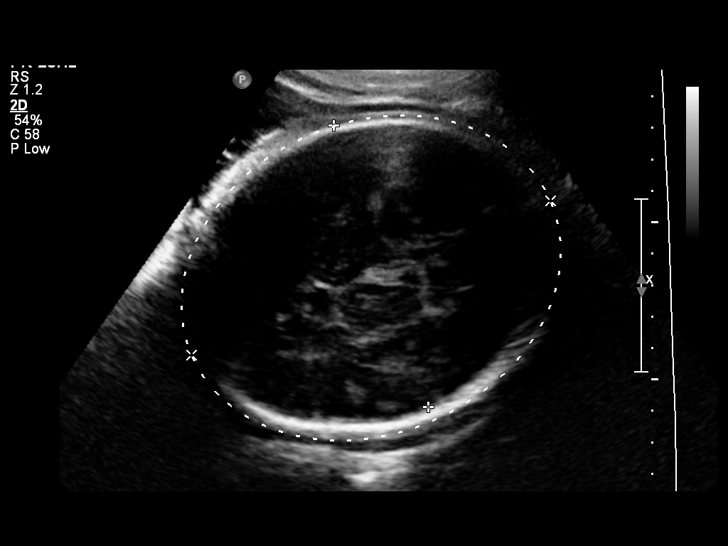
[im 4/34]
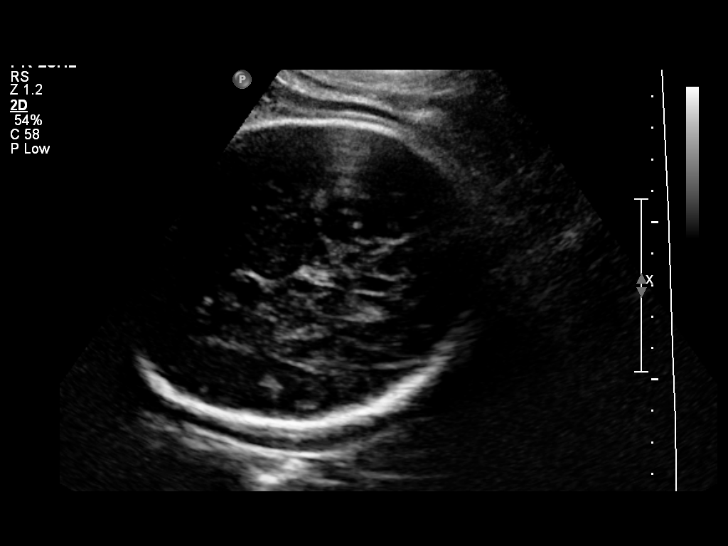
[im 7/34]
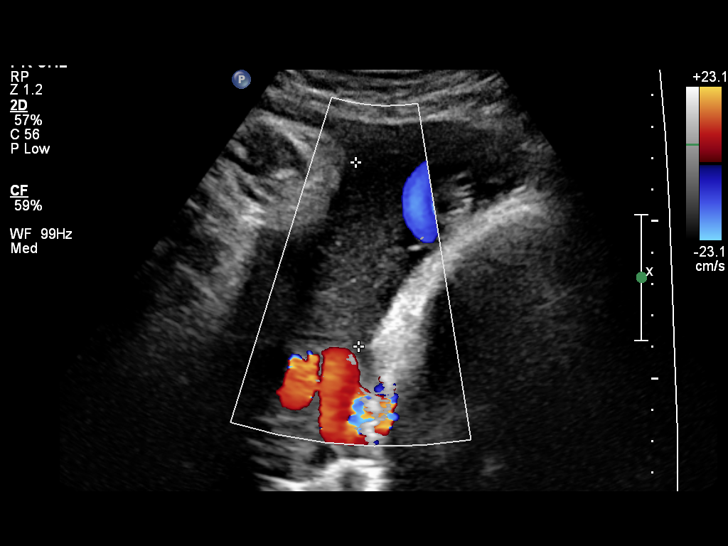
[im 10/34]
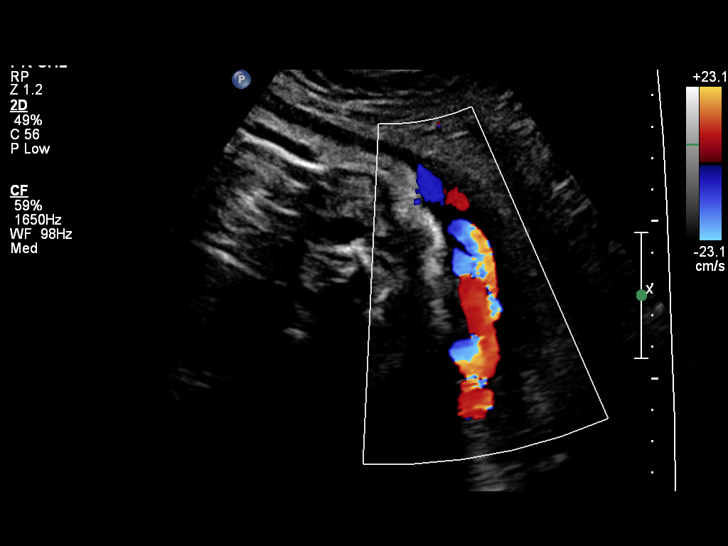
[im 13/34]
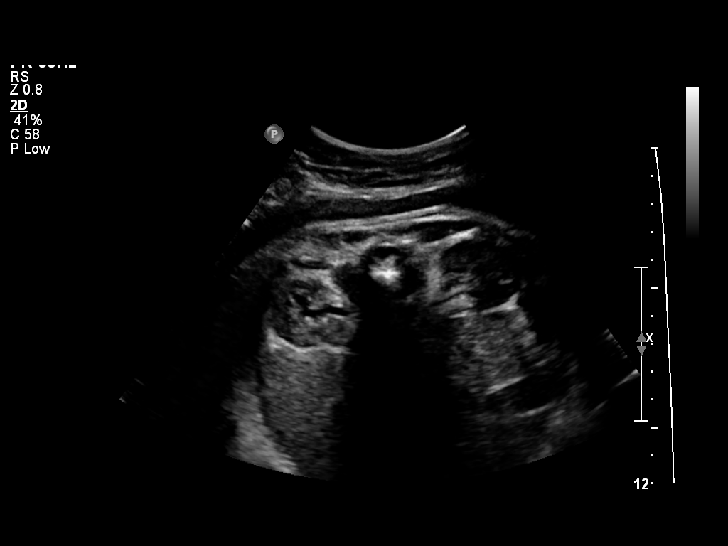
[im 15/34]
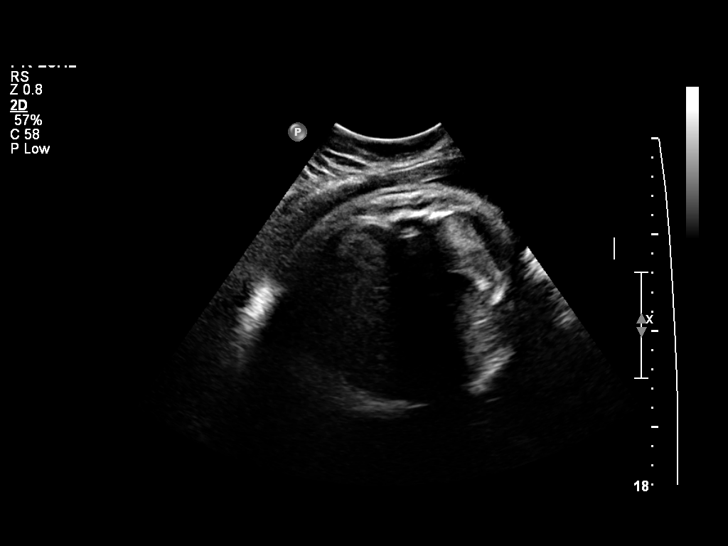
[im 19/34]
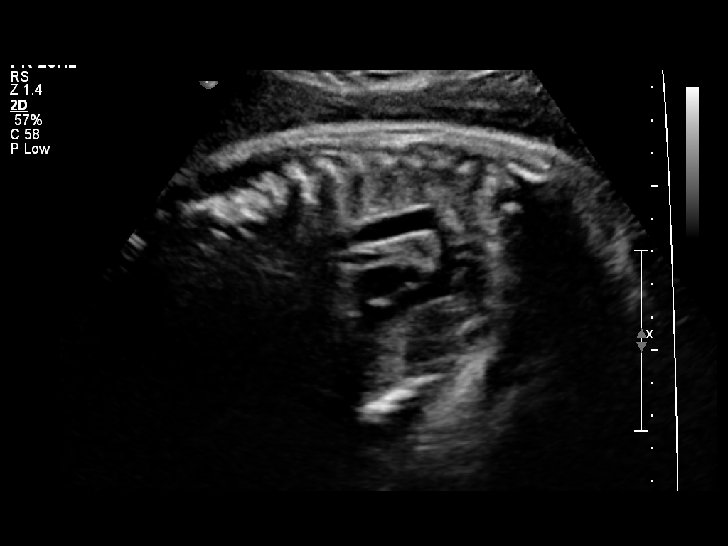
[im 21/34]
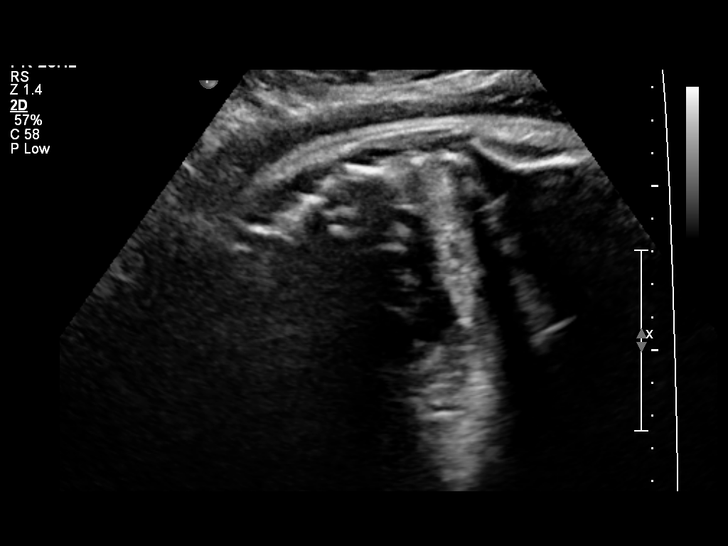
[im 24/34]
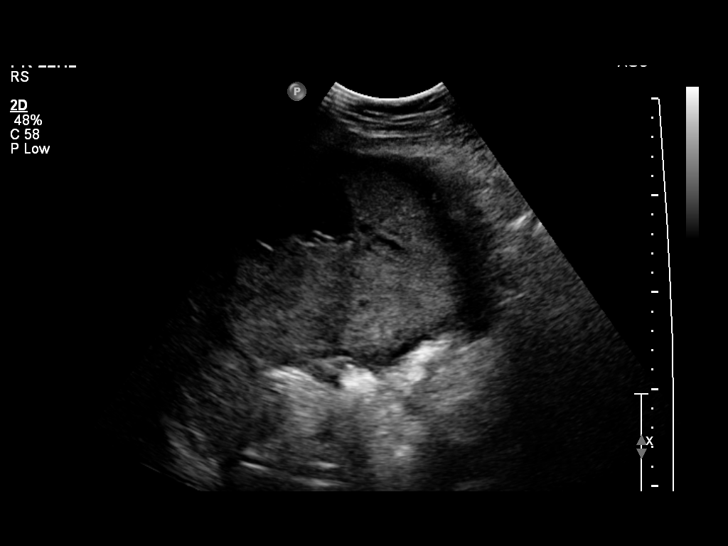
[im 27/34]
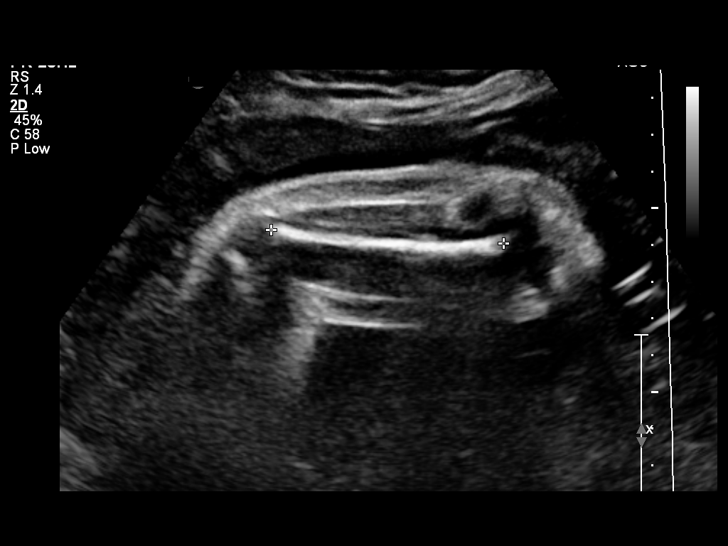
[im 30/34]
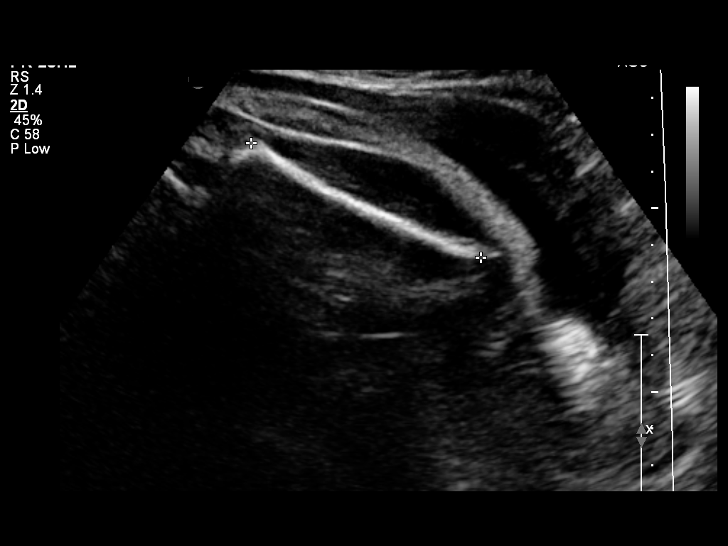
[im 32/34]
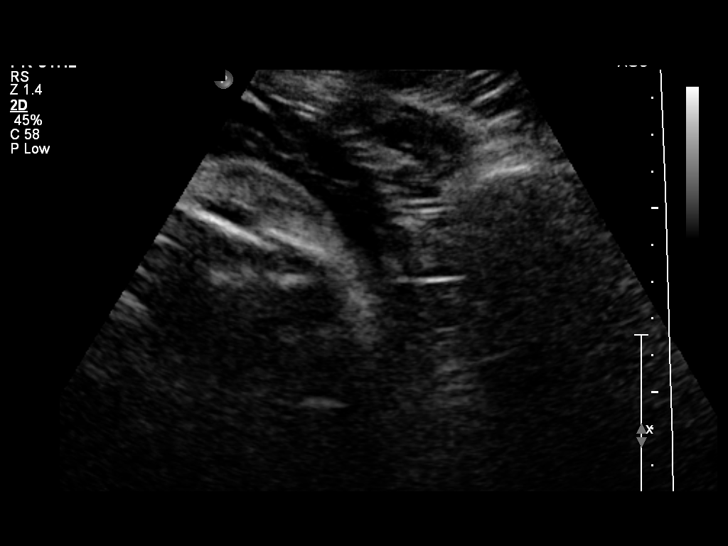

[12 of 28 positions shown; findings below may reference images not displayed]

OBSTETRICS REPORT
                      (Signed Final 08/13/2014 [DATE])

Service(s) Provided

 US OB FOLLOW UP                                       76816.1
Indications

 37 weeks gestation of pregnancy
 Obesity complicating pregnancy, third trimester
 Uterine size-date discrepancy, third trimester
Fetal Evaluation

 Num Of Fetuses:    1
 Fetal Heart Rate:  130                          bpm
 Cardiac Activity:  Observed
 Presentation:      Cephalic
 Placenta:          Posterior, above cervical
                    os
 P. Cord            Previously Visualized
 Insertion:

 Amniotic Fluid
 AFI FV:      Subjectively within normal limits
 AFI Sum:     16.83   cm       65  %Tile     Larg Pckt:    7.61  cm
 RUQ:   7.61    cm   RLQ:    5.82   cm    LUQ:   3.4     cm
Biometry

 BPD:     93.9  mm     G. Age:  38w 2d                CI:        72.42   70 - 86
                                                      FL/HC:      19.9   20.9 -

 HC:       351  mm     G. Age:  40w 6d       91  %    HC/AC:      0.91   0.92 -

 AC:     384.2  mm     G. Age:  N/A        > 97  %    FL/BPD:     74.2   71 - 87
 FL:      69.7  mm     G. Age:  35w 5d        9  %    FL/AC:      18.1   20 - 24
 HUM:     62.6  mm     G. Age:  36w 2d       47  %

 Est. FW:    1750  gm      9 lb 1 oz   > 90  %
Gestational Age

 LMP:           37w 6d        Date:  11/21/13                 EDD:   08/28/14
 U/S Today:     38w 2d                                        EDD:   08/25/14
 Best:          37w 6d     Det. By:  LMP  (11/21/13)          EDD:   08/28/14
Anatomy
 Cranium:          Appears normal         Aortic Arch:      Appears normal
 Fetal Cavum:      Previously seen        Ductal Arch:      Appears normal
 Ventricles:       Appears normal         Diaphragm:        Previously seen
 Choroid Plexus:   Previously seen        Stomach:          Appears normal, left
                                                            sided
 Cerebellum:       Previously seen        Abdomen:          Previously seen
 Posterior Fossa:  Previously seen        Abdominal Wall:   Previously seen
 Nuchal Fold:      Previously seen        Cord Vessels:     Previously seen
 Face:             Orbits and profile     Kidneys:          Appear normal
                   previously seen
 Lips:             Previously seen        Bladder:          Appears normal
 Heart:            Previously seen        Spine:            Previously seen
 RVOT:             Previously seen        Lower             Previously seen
                                          Extremities:
 LVOT:             Previously seen        Upper             Previously seen
                                          Extremities:

 Other:  Male gender. Heels previously seen.
Cervix Uterus Adnexa

 Cervix:       Not visualized (advanced GA >30wks)
 Uterus:       No abnormality visualized.
 Cul De Sac:   No free fluid seen.

 Left Ovary:    Not visualized.
 Right Ovary:   Not visualized.
 Adnexa:     No abnormality visualized.
Impression

 Single living intrauterine pregnancy at 37 weeks 6 days.
 Greater than expected interval fetal growth (>90%).
 Normal amniotic fluid volume.
 Normal interval fetal anatomy.
Recommendations

 Follow-up ultrasounds as clinically indicated.

 questions or concerns.
                Marii, Guerito

## 2016-10-17 ENCOUNTER — Emergency Department (HOSPITAL_BASED_OUTPATIENT_CLINIC_OR_DEPARTMENT_OTHER)
Admission: EM | Admit: 2016-10-17 | Discharge: 2016-10-17 | Disposition: A | Discharge status: Home / Self Care | Payer: Medicaid Other | Attending: Emergency Medicine | Admitting: Emergency Medicine

## 2016-10-17 ENCOUNTER — Encounter (HOSPITAL_BASED_OUTPATIENT_CLINIC_OR_DEPARTMENT_OTHER): Payer: Self-pay

## 2016-10-17 DIAGNOSIS — T50B95A Adverse effect of other viral vaccines, initial encounter: Secondary | ICD-10-CM | POA: Insufficient documentation

## 2016-10-17 DIAGNOSIS — T881XXA Other complications following immunization, not elsewhere classified, initial encounter: Secondary | ICD-10-CM

## 2016-10-17 DIAGNOSIS — R2232 Localized swelling, mass and lump, left upper limb: Secondary | ICD-10-CM | POA: Diagnosis present

## 2016-10-17 DIAGNOSIS — Y939 Activity, unspecified: Secondary | ICD-10-CM | POA: Diagnosis not present

## 2016-10-17 DIAGNOSIS — T887XXA Unspecified adverse effect of drug or medicament, initial encounter: Secondary | ICD-10-CM | POA: Diagnosis not present

## 2016-10-17 DIAGNOSIS — Y829 Unspecified medical devices associated with adverse incidents: Secondary | ICD-10-CM | POA: Insufficient documentation

## 2016-10-17 DIAGNOSIS — Y999 Unspecified external cause status: Secondary | ICD-10-CM | POA: Insufficient documentation

## 2016-10-17 DIAGNOSIS — Z87891 Personal history of nicotine dependence: Secondary | ICD-10-CM | POA: Diagnosis not present

## 2016-10-17 DIAGNOSIS — Y929 Unspecified place or not applicable: Secondary | ICD-10-CM | POA: Insufficient documentation

## 2016-10-17 NOTE — ED Triage Notes (Signed)
Pt c/o left arm swelling/red area after receiving immunization today-NAD-steady gait

## 2016-10-17 NOTE — Discharge Instructions (Signed)
It was my pleasure taking care of you today!   Topical hydrocortisone to the area for swelling/itching. You can also take Benadryl as needed for itching.  Return to ER for recheck if redness continues to progress, new symptoms develop or you have any additional concerns.
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
## 2016-10-18 ENCOUNTER — Emergency Department (HOSPITAL_BASED_OUTPATIENT_CLINIC_OR_DEPARTMENT_OTHER)
Admission: EM | Admit: 2016-10-18 | Discharge: 2016-10-18 | Disposition: A | Discharge status: Home / Self Care | Payer: Medicaid Other | Attending: Emergency Medicine | Admitting: Emergency Medicine

## 2016-10-18 ENCOUNTER — Encounter (HOSPITAL_BASED_OUTPATIENT_CLINIC_OR_DEPARTMENT_OTHER): Payer: Self-pay | Admitting: Emergency Medicine

## 2016-10-18 DIAGNOSIS — Z87891 Personal history of nicotine dependence: Secondary | ICD-10-CM | POA: Insufficient documentation

## 2016-10-18 DIAGNOSIS — Y829 Unspecified medical devices associated with adverse incidents: Secondary | ICD-10-CM | POA: Insufficient documentation

## 2016-10-18 DIAGNOSIS — L539 Erythematous condition, unspecified: Secondary | ICD-10-CM | POA: Diagnosis present

## 2016-10-18 DIAGNOSIS — T8062XD Other serum reaction due to vaccination, subsequent encounter: Secondary | ICD-10-CM | POA: Insufficient documentation

## 2016-10-18 DIAGNOSIS — T881XXD Other complications following immunization, not elsewhere classified, subsequent encounter: Secondary | ICD-10-CM
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
## 2016-10-18 NOTE — Discharge Instructions (Signed)
Recommend taking an anti-inflammatory like Motrin or Naprosyn. Over-the-counter versions would be at Advil or  Aleve. Would expect things start to improve over the next 48 hours. Return for any trouble breathing, hives, or rash in other places. Tongue swelling lip swelling also would not expect any black areas at the area of the local reaction.

## 2016-10-18 NOTE — ED Triage Notes (Signed)
Pt was seen here yesterday following a skin reaction to an immunization. The area was marked with a skin marker. Pt states the redness has extended beyond the marker today.

## 2016-10-29 ENCOUNTER — Encounter: Payer: Self-pay | Admitting: Obstetrics & Gynecology

## 2016-10-29 ENCOUNTER — Ambulatory Visit (INDEPENDENT_AMBULATORY_CARE_PROVIDER_SITE_OTHER): Payer: Medicaid Other | Admitting: Obstetrics & Gynecology

## 2016-10-29 ENCOUNTER — Other Ambulatory Visit (HOSPITAL_COMMUNITY)
Admission: RE | Admit: 2016-10-29 | Discharge: 2016-10-29 | Disposition: A | Discharge status: Home / Self Care | Payer: Medicaid Other | Source: Ambulatory Visit | Attending: Obstetrics & Gynecology | Admitting: Obstetrics & Gynecology

## 2016-10-29 VITALS — BP 119/70 | HR 79 | Wt 174.8 lb

## 2016-10-29 DIAGNOSIS — Z975 Presence of (intrauterine) contraceptive device: Secondary | ICD-10-CM

## 2016-10-29 DIAGNOSIS — Z3046 Encounter for surveillance of implantable subdermal contraceptive: Secondary | ICD-10-CM

## 2016-10-29 DIAGNOSIS — Z3049 Encounter for surveillance of other contraceptives: Secondary | ICD-10-CM

## 2016-10-29 NOTE — Patient Instructions (Signed)
Etonogestrel implant What is this medicine? ETONOGESTREL (et oh noe JES trel) is a contraceptive (birth control) device. It is used to prevent pregnancy. It can be used for up to 3 years. This medicine may be used for other purposes; ask your health care provider or pharmacist if you have questions. COMMON BRAND NAME(S): Implanon, Nexplanon What should I tell my health care provider before I take this medicine? They need to know if you have any of these conditions: -abnormal vaginal bleeding -blood vessel disease or blood clots -cancer of the breast, cervix, or liver -depression -diabetes -gallbladder disease -headaches -heart disease or recent heart attack -high blood pressure -high cholesterol -kidney disease -liver disease -renal disease -seizures -tobacco smoker -an unusual or allergic reaction to etonogestrel, other hormones, anesthetics or antiseptics, medicines, foods, dyes, or preservatives -pregnant or trying to get pregnant -breast-feeding How should I use this medicine? This device is inserted just under the skin on the inner side of your upper arm by a health care professional. Talk to your pediatrician regarding the use of this medicine in children. Special care may be needed. Overdosage: If you think you have taken too much of this medicine contact a poison control center or emergency room at once. NOTE: This medicine is only for you. Do not share this medicine with others. What if I miss a dose? This does not apply. What may interact with this medicine? Do not take this medicine with any of the following medications: -amprenavir -bosentan -fosamprenavir This medicine may also interact with the following medications: -barbiturate medicines for inducing sleep or treating seizures -certain medicines for fungal infections like ketoconazole and itraconazole -grapefruit juice -griseofulvin -medicines to treat seizures like carbamazepine, felbamate, oxcarbazepine,  phenytoin, topiramate -modafinil -phenylbutazone -rifampin -rufinamide -some medicines to treat HIV infection like atazanavir, indinavir, lopinavir, nelfinavir, tipranavir, ritonavir -St. John's wort This list may not describe all possible interactions. Give your health care provider a list of all the medicines, herbs, non-prescription drugs, or dietary supplements you use. Also tell them if you smoke, drink alcohol, or use illegal drugs. Some items may interact with your medicine. What should I watch for while using this medicine? This product does not protect you against HIV infection (AIDS) or other sexually transmitted diseases. You should be able to feel the implant by pressing your fingertips over the skin where it was inserted. Contact your doctor if you cannot feel the implant, and use a non-hormonal birth control method (such as condoms) until your doctor confirms that the implant is in place. If you feel that the implant may have broken or become bent while in your arm, contact your healthcare provider. What side effects may I notice from receiving this medicine? Side effects that you should report to your doctor or health care professional as soon as possible: -allergic reactions like skin rash, itching or hives, swelling of the face, lips, or tongue -breast lumps -changes in emotions or moods -depressed mood -heavy or prolonged menstrual bleeding -pain, irritation, swelling, or bruising at the insertion site -scar at site of insertion -signs of infection at the insertion site such as fever, and skin redness, pain or discharge -signs of pregnancy -signs and symptoms of a blood clot such as breathing problems; changes in vision; chest pain; severe, sudden headache; pain, swelling, warmth in the leg; trouble speaking; sudden numbness or weakness of the face, arm or leg -signs and symptoms of liver injury like dark yellow or brown urine; general ill feeling or flu-like symptoms;  light-colored   stools; loss of appetite; nausea; right upper belly pain; unusually weak or tired; yellowing of the eyes or skin -unusual vaginal bleeding, discharge -signs and symptoms of a stroke like changes in vision; confusion; trouble speaking or understanding; severe headaches; sudden numbness or weakness of the face, arm or leg; trouble walking; dizziness; loss of balance or coordination Side effects that usually do not require medical attention (report to your doctor or health care professional if they continue or are bothersome): -acne -back pain -breast pain -changes in weight -dizziness -general ill feeling or flu-like symptoms -headache -irregular menstrual bleeding -nausea -sore throat -vaginal irritation or inflammation This list may not describe all possible side effects. Call your doctor for medical advice about side effects. You may report side effects to FDA at 1-800-FDA-1088. Where should I keep my medicine? This drug is given in a hospital or clinic and will not be stored at home. NOTE: This sheet is a summary. It may not cover all possible information. If you have questions about this medicine, talk to your doctor, pharmacist, or health care provider.  2018 Elsevier/Gold Standard (2015-11-17 11:19:22)  

## 2016-10-29 NOTE — Progress Notes (Signed)
PHQ9 16, GAD7 18. Pt declines [facility]BH referral, feels like the low mood is d/t the Nexplanon.

## 2016-10-29 NOTE — Progress Notes (Signed)
GYNECOLOGY OFFICE PROCEDURE NOTE  Jenna Tran is a 31 y.o. [phone] [id number] here for Nexplanon removal.  Last pap smear was on 05/2015 and was normal.  No other gynecologic concerns. She has mood change that she attributes to the nexplanon and requests removal. STD testing also desired today   Nexplanon Removal Patient identified, informed consent performed, consent signed.   Appropriate time out taken. Nexplanon site identified.  Area prepped in usual sterile fashon. One ml of 1% lidocaine was used to anesthetize the area at the distal end of the implant. A small stab incision was made right beside the implant on the distal portion.  The Nexplanon rod was grasped using hemostats and removed without difficulty.  There was minimal blood loss. There were no complications.  Steri-strips were applied over the small incision.  A pressure bandage was applied to reduce any bruising.  The patient tolerated the procedure well and was given post procedure instructions.  Patient is unsure what to  for contraception/ She will abstain or use condoms and spermacide for now.     Adam PhenixArnold, Naiara Lombardozzi G, MD Attending Obstetrician & Gynecologist, [facility]

## 2016-10-30 LAB — HEPATITIS B SURFACE ANTIGEN: Hepatitis B Surface Ag: NEGATIVE

## 2016-10-30 LAB — HIV ANTIBODY (ROUTINE TESTING W REFLEX): HIV Screen 4th Generation wRfx: NONREACTIVE

## 2016-10-30 LAB — HEPATITIS C ANTIBODY: Hep C Virus Ab: <0.1 s/co ratio (ref 0.0–0.9)

## 2016-10-31 LAB — GC/CHLAMYDIA PROBE AMP (~~LOC~~) NOT AT ARMC
CHLAMYDIA, DNA PROBE: NEGATIVE
NEISSERIA GONORRHEA: NEGATIVE

## 2016-11-08 ENCOUNTER — Emergency Department (HOSPITAL_BASED_OUTPATIENT_CLINIC_OR_DEPARTMENT_OTHER)
Admission: EM | Admit: 2016-11-08 | Discharge: 2016-11-09 | Disposition: A | Discharge status: Home / Self Care | Payer: Medicaid Other | Attending: Emergency Medicine | Admitting: Emergency Medicine

## 2016-11-08 ENCOUNTER — Encounter (HOSPITAL_BASED_OUTPATIENT_CLINIC_OR_DEPARTMENT_OTHER): Payer: Self-pay | Admitting: *Deleted

## 2016-11-08 DIAGNOSIS — N898 Other specified noninflammatory disorders of vagina: Secondary | ICD-10-CM | POA: Diagnosis not present

## 2016-11-08 DIAGNOSIS — R109 Unspecified abdominal pain: Secondary | ICD-10-CM | POA: Insufficient documentation

## 2016-11-08 DIAGNOSIS — Z87891 Personal history of nicotine dependence: Secondary | ICD-10-CM | POA: Diagnosis not present

## 2016-11-08 DIAGNOSIS — R102 Pelvic and perineal pain: Secondary | ICD-10-CM

## 2016-11-08 LAB — URINALYSIS, ROUTINE W REFLEX MICROSCOPIC
BILIRUBIN URINE: NEGATIVE
Glucose, UA: NEGATIVE mg/dL
Hgb urine dipstick: NEGATIVE
KETONES UR: NEGATIVE mg/dL
NITRITE: NEGATIVE
PROTEIN: NEGATIVE mg/dL
Specific Gravity, Urine: 1.014 (ref 1.005–1.030)
pH: 5.5 (ref 5.0–8.0)

## 2016-11-08 LAB — URINALYSIS, MICROSCOPIC (REFLEX)

## 2016-11-08 LAB — WET PREP, GENITAL
SPERM: NONE SEEN
Trich, Wet Prep: NONE SEEN
Yeast Wet Prep HPF POC: NONE SEEN

## 2016-11-08 LAB — PREGNANCY, URINE: PREG TEST UR: NEGATIVE

## 2016-11-08 MED ORDER — NAPROXEN 250 MG PO TABS
500.0000 mg | ORAL_TABLET | Freq: Once | ORAL | Status: AC
  Administered 2016-11-08: 500 mg via ORAL
  Filled 2016-11-08: qty 2

## 2016-11-08 MED ORDER — NAPROXEN 500 MG PO TABS: 500.0000 mg | ORAL_TABLET | Freq: Two times a day (BID) | ORAL | 0 refills | Status: AC | PRN

## 2016-11-08 NOTE — ED Notes (Signed)
ED Provider at bedside. 

## 2016-11-08 NOTE — ED Provider Notes (Signed)
[facility] Provider Note: Jenna Tran Jenna Eberwein, MD, FACEP  CSN: [id number] MRN: [id number] ARRIVAL: 11/08/16 at 2116 ROOM: MH02/[facility]02   CHIEF COMPLAINT  Pelvic Pain   HISTORY OF PRESENT ILLNESS  Jenna Tran is a 31 y.o. female who had her Nexplanon contraceptive implant removed on June 18. She is here with a mucoid vaginal discharge for one week with pelvic pain for one day. She states her pelvic pain is a 5 out of 10 currently but is getting worse. She describes it as constant and cramping. Pain is worse with movement or palpation. She denies vaginal bleeding. She denies fever, chills, nausea, vomiting or diarrhea.   Past Medical History:  Diagnosis Date  . Dysplasia, cervix uteri   . Plantar fasciitis   . Vaginal Pap smear, abnormal    dysplasia when 31 yo, normal pap since    Past Surgical History:  Procedure Laterality Date  . WISDOM TOOTH EXTRACTION Bilateral 2011    Family History  Problem Relation Age of Onset  . Cancer Mother        brain tumor and breast cancer and uterine    Social History  Substance Use Topics  . Smoking status: Former Smoker    Quit date: 05/30/2012  . Smokeless tobacco: Never Used  . Alcohol use No    Prior to Admission medications   Not on File    Allergies Lactose intolerance (gi)   REVIEW OF SYSTEMS  Negative except as noted here or in the History of Present Illness.   PHYSICAL EXAMINATION  Initial Vital Signs Blood pressure 102/81, pulse 92, temperature 98.3 F (36.8 C), temperature source Oral, resp. rate 16, height 5\' 1"  (1.549 m), weight 78.9 kg (174 lb), last menstrual period 10/25/2016, SpO2 100 %.  Examination General: Well-developed, well-nourished female in no acute distress; appearance consistent with age of record HENT: normocephalic; atraumatic Eyes: pupils equal, round and reactive to light; extraocular muscles intact Neck: supple Heart: regular rate and rhythm Lungs: clear to auscultation  bilaterally Abdomen: soft; nondistended; suprapubic tenderness; no masses or hepatosplenomegaly; bowel sounds present GU: Normal external genitalia; no vaginal bleeding; copious mucoid vaginal discharge; uterine tenderness; no abnormal odor Extremities: No deformity; full range of motion; pulses normal Neurologic: Awake, alert and oriented; motor function intact in all extremities and symmetric; no facial droop Skin: Warm and dry Psychiatric: Normal mood and affect   RESULTS  Summary of this visit's results, reviewed by myself:   EKG Interpretation  Date/Time:    Ventricular Rate:    PR Interval:    QRS Duration:   QT Interval:    QTC Calculation:   R Axis:     Text Interpretation:        Laboratory Studies: Results for orders placed or performed during the hospital encounter of 11/08/16 (from the past 24 hour(s))  Urinalysis, Routine w reflex microscopic     Status: Abnormal   Collection Time: 11/08/16 10:37 PM  Result Value Ref Range   Color, Urine YELLOW YELLOW   APPearance CLOUDY (A) CLEAR   Specific Gravity, Urine 1.014 1.005 - 1.030   pH 5.5 5.0 - 8.0   Glucose, UA NEGATIVE NEGATIVE mg/dL   Hgb urine dipstick NEGATIVE NEGATIVE   Bilirubin Urine NEGATIVE NEGATIVE   Ketones, ur NEGATIVE NEGATIVE mg/dL   Protein, ur NEGATIVE NEGATIVE mg/dL   Nitrite NEGATIVE NEGATIVE   Leukocytes, UA TRACE (A) NEGATIVE  Pregnancy, urine     Status: None   Collection Time: 11/08/16 10:37 PM  Result Value Ref Range   Preg Test, Ur NEGATIVE NEGATIVE  Urinalysis, Microscopic (reflex)     Status: Abnormal   Collection Time: 11/08/16 10:37 PM  Result Value Ref Range   RBC / HPF 0-5 0 - 5 RBC/hpf   WBC, UA 6-30 0 - 5 WBC/hpf   Bacteria, UA FEW (A) NONE SEEN   Squamous Epithelial / LPF 6-30 (A) NONE SEEN  Wet prep, genital     Status: Abnormal   Collection Time: 11/08/16 11:16 PM  Result Value Ref Range   Yeast Wet Prep HPF POC NONE SEEN NONE SEEN   Trich, Wet Prep NONE SEEN NONE  SEEN   Clue Cells Wet Prep HPF POC PRESENT (A) NONE SEEN   WBC, Wet Prep HPF POC MANY (A) NONE SEEN   Sperm NONE SEEN    Imaging Studies: No results found.  ED COURSE  Nursing notes and initial vitals signs, including pulse oximetry, reviewed.  Vitals:   11/08/16 2123  BP: 102/81  Pulse: 92  Resp: 16  Temp: 98.3 F (36.8 C)  TempSrc: Oral  SpO2: 100%  Weight: 78.9 kg (174 lb)  Height: 5\' 1"  (1.549 m)   11:50 PM I suspect the patient's symptoms are due to hormone withdrawal from recent removal of contraceptive implant. She was advised to take NSAIDs and contact her OB/GYN if symptoms persist.  PROCEDURES    ED DIAGNOSES     ICD-10-CM   1. Pelvic pain in female R10.2   2. Clear vaginal discharge N89.8        Ida Uppal, Jonny Ruiz, MD 11/08/16 2351

## 2016-11-08 NOTE — ED Triage Notes (Signed)
Crampy pelvic pain with mucus like vaginal discharge x 1 week. States June 18 she had her BC removed from her left arm.

## 2016-11-10 LAB — URINE CULTURE: Culture: <10000 — AB

## 2016-11-12 LAB — GC/CHLAMYDIA PROBE AMP (~~LOC~~) NOT AT ARMC
CHLAMYDIA, DNA PROBE: NEGATIVE
NEISSERIA GONORRHEA: NEGATIVE

## 2017-06-06 ENCOUNTER — Emergency Department (HOSPITAL_BASED_OUTPATIENT_CLINIC_OR_DEPARTMENT_OTHER)
Admission: EM | Admit: 2017-06-06 | Discharge: 2017-06-06 | Disposition: A | Discharge status: Home / Self Care | Payer: Medicaid Other | Attending: Emergency Medicine | Admitting: Emergency Medicine

## 2017-06-06 ENCOUNTER — Encounter (HOSPITAL_BASED_OUTPATIENT_CLINIC_OR_DEPARTMENT_OTHER): Payer: Self-pay | Admitting: Emergency Medicine

## 2017-06-06 ENCOUNTER — Emergency Department (HOSPITAL_BASED_OUTPATIENT_CLINIC_OR_DEPARTMENT_OTHER): Payer: Medicaid Other

## 2017-06-06 ENCOUNTER — Other Ambulatory Visit: Payer: Self-pay

## 2017-06-06 DIAGNOSIS — Z79899 Other long term (current) drug therapy: Secondary | ICD-10-CM | POA: Insufficient documentation

## 2017-06-06 DIAGNOSIS — R51 Headache: Secondary | ICD-10-CM | POA: Diagnosis present

## 2017-06-06 DIAGNOSIS — Z87891 Personal history of nicotine dependence: Secondary | ICD-10-CM | POA: Diagnosis not present

## 2017-06-06 DIAGNOSIS — R519 Headache, unspecified: Secondary | ICD-10-CM

## 2017-06-06 MED ORDER — IBUPROFEN 400 MG PO TABS
600.0000 mg | ORAL_TABLET | Freq: Once | ORAL | Status: AC
  Administered 2017-06-06: 21:00:00 600 mg via ORAL
  Filled 2017-06-06: qty 1

## 2017-06-06 NOTE — ED Notes (Signed)
Patient transported to CT 
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
## 2017-06-06 NOTE — ED Triage Notes (Signed)
Pt c/o shooting pain from front to back of head today; reports eye twitching and felt like she was going to pass out at work today.

## 2017-07-11 ENCOUNTER — Other Ambulatory Visit: Payer: Self-pay

## 2017-07-11 ENCOUNTER — Ambulatory Visit: Payer: Self-pay | Admitting: Obstetrics & Gynecology

## 2017-07-11 DIAGNOSIS — Z8619 Personal history of other infectious and parasitic diseases: Secondary | ICD-10-CM
